# Patient Record
Sex: Female | Born: 1945 | Race: White | Hispanic: No | Smoking: Former smoker
Health system: Southern US, Community
[De-identification: ages and names within clinical notes are randomized; demographics above are authoritative.]

## PROBLEM LIST (undated history)

## (undated) DIAGNOSIS — J189 Pneumonia, unspecified organism: Secondary | ICD-10-CM

## (undated) DIAGNOSIS — I10 Essential (primary) hypertension: Principal | ICD-10-CM

## (undated) DIAGNOSIS — M1990 Unspecified osteoarthritis, unspecified site: Secondary | ICD-10-CM

## (undated) DIAGNOSIS — K219 Gastro-esophageal reflux disease without esophagitis: Secondary | ICD-10-CM

## (undated) DIAGNOSIS — R011 Cardiac murmur, unspecified: Secondary | ICD-10-CM

## (undated) DIAGNOSIS — Z8719 Personal history of other diseases of the digestive system: Secondary | ICD-10-CM

## (undated) DIAGNOSIS — E785 Hyperlipidemia, unspecified: Secondary | ICD-10-CM

## (undated) DIAGNOSIS — C801 Malignant (primary) neoplasm, unspecified: Secondary | ICD-10-CM

## (undated) HISTORY — PX: CATARACT EXTRACTION W/ INTRAOCULAR LENS IMPLANT: SHX1309

## (undated) HISTORY — PX: MINOR IRRIGATION AND DEBRIDEMENT OF WOUND: SHX6239

## (undated) HISTORY — PX: OTHER SURGICAL HISTORY: SHX169

## (undated) HISTORY — DX: Hyperlipidemia, unspecified: E78.5

## (undated) HISTORY — DX: Essential (primary) hypertension: I10

## (undated) HISTORY — PX: COLONOSCOPY: SHX174

## (undated) HISTORY — DX: Cardiac murmur, unspecified: R01.1

## (undated) HISTORY — PX: ESOPHAGOGASTRODUODENOSCOPY: SHX1529

## (undated) HISTORY — PX: TONSILLECTOMY: SUR1361

---

## 2001-06-16 ENCOUNTER — Other Ambulatory Visit: Admission: RE | Admit: 2001-06-16 | Discharge: 2001-06-16 | Payer: Self-pay | Admitting: Obstetrics and Gynecology

## 2001-10-01 ENCOUNTER — Encounter: Payer: Self-pay | Admitting: Obstetrics and Gynecology

## 2001-10-01 ENCOUNTER — Encounter: Admission: RE | Admit: 2001-10-01 | Discharge: 2001-10-01 | Payer: Self-pay | Admitting: Obstetrics and Gynecology

## 2002-12-09 ENCOUNTER — Encounter: Admission: RE | Admit: 2002-12-09 | Discharge: 2002-12-09 | Payer: Self-pay | Admitting: Obstetrics and Gynecology

## 2002-12-09 ENCOUNTER — Encounter: Payer: Self-pay | Admitting: Obstetrics and Gynecology

## 2003-02-02 ENCOUNTER — Other Ambulatory Visit: Admission: RE | Admit: 2003-02-02 | Discharge: 2003-02-02 | Payer: Self-pay | Admitting: Obstetrics and Gynecology

## 2003-04-21 ENCOUNTER — Encounter: Admission: RE | Admit: 2003-04-21 | Discharge: 2003-04-21 | Payer: Self-pay | Admitting: Obstetrics and Gynecology

## 2005-06-12 ENCOUNTER — Encounter: Admission: RE | Admit: 2005-06-12 | Discharge: 2005-06-12 | Payer: Self-pay | Admitting: Obstetrics and Gynecology

## 2005-07-03 ENCOUNTER — Encounter: Admission: RE | Admit: 2005-07-03 | Discharge: 2005-07-03 | Payer: Self-pay | Admitting: Obstetrics and Gynecology

## 2005-08-20 ENCOUNTER — Encounter: Admission: RE | Admit: 2005-08-20 | Discharge: 2005-08-20 | Payer: Self-pay | Admitting: Internal Medicine

## 2006-02-20 ENCOUNTER — Encounter: Admission: RE | Admit: 2006-02-20 | Discharge: 2006-02-20 | Payer: Self-pay | Admitting: Internal Medicine

## 2007-08-08 ENCOUNTER — Other Ambulatory Visit: Admission: RE | Admit: 2007-08-08 | Discharge: 2007-08-08 | Payer: Self-pay | Admitting: Obstetrics and Gynecology

## 2009-04-13 ENCOUNTER — Observation Stay (HOSPITAL_COMMUNITY): Admission: EM | Admit: 2009-04-13 | Discharge: 2009-04-14 | Payer: Self-pay | Admitting: Emergency Medicine

## 2009-04-20 ENCOUNTER — Encounter: Admission: RE | Admit: 2009-04-20 | Discharge: 2009-04-20 | Payer: Self-pay | Admitting: Neurosurgery

## 2009-05-05 ENCOUNTER — Encounter: Admission: RE | Admit: 2009-05-05 | Discharge: 2009-05-05 | Payer: Self-pay | Admitting: Neurosurgery

## 2010-06-11 ENCOUNTER — Encounter: Payer: Self-pay | Admitting: Internal Medicine

## 2010-06-11 ENCOUNTER — Encounter: Payer: Self-pay | Admitting: Obstetrics and Gynecology

## 2010-08-23 LAB — BASIC METABOLIC PANEL
BUN: 10 mg/dL (ref 6–23)
CO2: 28 mEq/L (ref 19–32)
Calcium: 9.5 mg/dL (ref 8.4–10.5)
Calcium: 9.5 mg/dL (ref 8.4–10.5)
Chloride: 106 mEq/L (ref 96–112)
Creatinine, Ser: 0.77 mg/dL (ref 0.4–1.2)
Creatinine, Ser: 0.81 mg/dL (ref 0.4–1.2)
GFR calc Af Amer: >60 mL/min (ref >60)
GFR calc non Af Amer: >60 mL/min (ref >60)
Glucose, Bld: 125 mg/dL — ABNORMAL HIGH (ref 70–99)
Sodium: 142 mEq/L (ref 135–145)

## 2010-08-23 LAB — CBC
Hemoglobin: 12.9 g/dL (ref 12.0–15.0)
MCHC: 33.7 g/dL (ref 30.0–36.0)
MCHC: 34.6 g/dL (ref 30.0–36.0)
MCV: 93.9 fL (ref 78.0–100.0)
RBC: 4.01 MIL/uL (ref 3.87–5.11)
RDW: 12.6 % (ref 11.5–15.5)
RDW: 12.7 % (ref 11.5–15.5)

## 2011-01-31 ENCOUNTER — Other Ambulatory Visit: Payer: Self-pay | Admitting: Obstetrics and Gynecology

## 2012-01-14 ENCOUNTER — Other Ambulatory Visit (HOSPITAL_COMMUNITY): Payer: Self-pay | Admitting: Cardiovascular Disease

## 2012-01-14 ENCOUNTER — Ambulatory Visit (HOSPITAL_COMMUNITY)
Admission: RE | Admit: 2012-01-14 | Discharge: 2012-01-14 | Disposition: A | Discharge status: Home / Self Care | Payer: BC Managed Care – PPO | Source: Ambulatory Visit | Attending: Cardiovascular Disease | Admitting: Cardiovascular Disease

## 2012-01-14 DIAGNOSIS — R059 Cough, unspecified: Secondary | ICD-10-CM | POA: Insufficient documentation

## 2012-01-14 DIAGNOSIS — R05 Cough: Secondary | ICD-10-CM

## 2012-09-08 ENCOUNTER — Ambulatory Visit (HOSPITAL_COMMUNITY)
Admission: RE | Admit: 2012-09-08 | Discharge: 2012-09-08 | Disposition: A | Discharge status: Home / Self Care | Payer: Medicare Other | Source: Ambulatory Visit | Attending: Internal Medicine | Admitting: Internal Medicine

## 2012-09-08 DIAGNOSIS — R011 Cardiac murmur, unspecified: Secondary | ICD-10-CM | POA: Insufficient documentation

## 2012-09-08 DIAGNOSIS — I10 Essential (primary) hypertension: Secondary | ICD-10-CM | POA: Insufficient documentation

## 2012-09-08 NOTE — Progress Notes (Signed)
*  PRELIMINARY RESULTS* Echocardiogram 2D Echocardiogram has been performed.  Amy Cannon, Amy Cannon 09/08/2012, 11:47 AM

## 2012-09-29 ENCOUNTER — Ambulatory Visit: Payer: PRIVATE HEALTH INSURANCE | Admitting: Family Medicine

## 2013-02-16 ENCOUNTER — Telehealth (HOSPITAL_COMMUNITY): Payer: Self-pay | Admitting: *Deleted

## 2013-02-16 ENCOUNTER — Other Ambulatory Visit: Payer: Self-pay | Admitting: Cardiovascular Disease

## 2013-02-16 LAB — COMPREHENSIVE METABOLIC PANEL
ALT: 17 U/L (ref 0–35)
Albumin: 4.1 g/dL (ref 3.5–5.2)
CO2: 31 mEq/L (ref 19–32)
Calcium: 10.4 mg/dL (ref 8.4–10.5)
Chloride: 104 mEq/L (ref 96–112)
Glucose, Bld: 93 mg/dL (ref 70–99)
Potassium: 4.6 mEq/L (ref 3.5–5.3)
Sodium: 140 mEq/L (ref 135–145)
Total Protein: 6.5 g/dL (ref 6.0–8.3)

## 2013-02-16 LAB — CBC WITH DIFFERENTIAL/PLATELET
Eosinophils Absolute: 0.1 10*3/uL (ref 0.0–0.7)
Hemoglobin: 14.3 g/dL (ref 12.0–15.0)
Lymphocytes Relative: 28 % (ref 12–46)
Lymphs Abs: 1.4 10*3/uL (ref 0.7–4.0)
MCH: 30.6 pg (ref 26.0–34.0)
MCV: 92.3 fL (ref 78.0–100.0)
Monocytes Relative: 8 % (ref 3–12)
Neutrophils Relative %: 62 % (ref 43–77)
Platelets: 292 10*3/uL (ref 150–400)
RBC: 4.67 MIL/uL (ref 3.87–5.11)
WBC: 5.1 10*3/uL (ref 4.0–10.5)

## 2013-02-16 LAB — LIPID PANEL
Cholesterol: 179 mg/dL (ref 0–200)
Triglycerides: 73 mg/dL (ref <150)
VLDL: 15 mg/dL (ref 0–40)

## 2013-02-17 ENCOUNTER — Encounter: Payer: Self-pay | Admitting: Cardiovascular Disease

## 2013-02-17 ENCOUNTER — Other Ambulatory Visit (HOSPITAL_COMMUNITY): Payer: Self-pay | Admitting: Cardiovascular Disease

## 2013-02-17 DIAGNOSIS — R079 Chest pain, unspecified: Secondary | ICD-10-CM

## 2013-02-24 ENCOUNTER — Encounter (HOSPITAL_COMMUNITY): Payer: PRIVATE HEALTH INSURANCE

## 2013-03-03 ENCOUNTER — Ambulatory Visit (HOSPITAL_COMMUNITY)
Admission: RE | Admit: 2013-03-03 | Discharge: 2013-03-03 | Disposition: A | Discharge status: Home / Self Care | Payer: Medicare Other | Source: Ambulatory Visit | Attending: Cardiovascular Disease | Admitting: Cardiovascular Disease

## 2013-03-03 ENCOUNTER — Other Ambulatory Visit: Payer: Self-pay | Admitting: *Deleted

## 2013-03-03 ENCOUNTER — Encounter: Payer: Self-pay | Admitting: *Deleted

## 2013-03-03 DIAGNOSIS — Z87891 Personal history of nicotine dependence: Secondary | ICD-10-CM | POA: Insufficient documentation

## 2013-03-03 DIAGNOSIS — I10 Essential (primary) hypertension: Secondary | ICD-10-CM | POA: Insufficient documentation

## 2013-03-03 DIAGNOSIS — R079 Chest pain, unspecified: Secondary | ICD-10-CM

## 2013-03-03 DIAGNOSIS — Z8249 Family history of ischemic heart disease and other diseases of the circulatory system: Secondary | ICD-10-CM | POA: Insufficient documentation

## 2013-03-03 DIAGNOSIS — R011 Cardiac murmur, unspecified: Secondary | ICD-10-CM | POA: Insufficient documentation

## 2013-03-03 MED ORDER — TECHNETIUM TC 99M SESTAMIBI GENERIC - CARDIOLITE
10.9000 | Freq: Once | INTRAVENOUS | Status: AC | PRN
  Administered 2013-03-03: 10.9 via INTRAVENOUS

## 2013-03-03 MED ORDER — TECHNETIUM TC 99M SESTAMIBI GENERIC - CARDIOLITE
31.0000 | Freq: Once | INTRAVENOUS | Status: AC | PRN
  Administered 2013-03-03: 31 via INTRAVENOUS

## 2013-03-03 NOTE — Procedures (Addendum)
[facility] [address] [place] [address] [phone]  Cardiology Nuclear Med Study  Amy Cannon is Cannon 67 y.o. female     MRN : [id number]     DOB: November 02, 1945  Procedure Date: 03/03/2013  Nuclear Med Background Indication for Stress Test:  Evaluation for Ischemia History:  Murmur Cardiac Risk Factors: Family History - CAD, History of Smoking, Hypertension and Lipids  Symptoms:  Pt denies being symptomatic at this time.   Nuclear Pre-Procedure Caffeine/Decaff Intake:  1:00am NPO After: 11 am   IV Site: R Hand  IV 0.9% NS with Angio Cath:  22g  Chest Size (in):  n/Cannon IV Started by: Emmit Pomfret, RN  Height: 5\' 7"  (1.702 m)  Cup Size: C  BMI:  Body mass index is 21.92 kg/(m^2). Weight:  140 lb (63.504 kg)   Tech Comments:  n/Cannon    Nuclear Med Study 1 or 2 day study: 1 day  Stress Test Type:  Stress  Order Authorizing Provider:  Susa Griffins, MD   Resting Radionuclide: Technetium 16m Sestamibi  Resting Radionuclide Dose: 10.9 mCi   Stress Radionuclide:  Technetium 66m Sestamibi  Stress Radionuclide Dose: 31.0 mCi           Stress Protocol Rest HR: 67 Stress HR: 146  Rest BP: 152/100 Stress BP: 207/100  Exercise Time (min): 11:30 METS: 13.4   Predicted Max HR: 155 bpm % Max HR: 94.19 bpm Rate Pressure Product: 21308  Dose of Adenosine (mg):  n/Cannon Dose of Lexiscan: n/Cannon mg  Dose of Atropine (mg): n/Cannon Dose of Dobutamine: n/Cannon mcg/kg/min (at max HR)  Stress Test Technologist: Esperanza Sheets, CCT Nuclear Technologist: Gonzella Lex, CNMT   Rest Procedure:  Myocardial perfusion imaging was performed at rest 45 minutes following the intravenous administration of Technetium 69m Sestamibi. Stress Procedure:  The patient performed treadmill exercise using Cannon Bruce  Protocol for 11zz;30 minutes. The patient stopped due to SOB and denied any chest pain.  There were no significant ST-T wave changes.  Technetium 20m  Sestamibi was injected at peak exercise and myocardial perfusion imaging was performed after Cannon brief delay.  Transient Ischemic Dilatation (Normal <1.22):  0.79 Lung/Heart Ratio (Normal <0.45):  0.32 QGS EDV:  70 ml QGS ESV:  21 ml LV Ejection Fraction: 70%  Signed by     Rest ECG: NSR - Normal EKG  Stress ECG: No significant ST segment change suggestive of ischemia; PVC during Stage 4 of exercise  QPS Raw Data Images:  Normal; no motion artifact; normal heart/lung ratio. Stress Images:  Normal homogeneous uptake in all areas of the myocardium. Rest Images:  Normal homogeneous uptake in all areas of the myocardium. Subtraction (SDS):  Normal  Impression Exercise Capacity:  Good exercise capacity. BP Response:  Hypertensive blood pressure response. Clinical Symptoms:  No significant symptoms noted. ECG Impression:  No significant ST segment change suggestive of ischemia. Comparison with Prior Nuclear Study: No images to compare  Overall Impression:  Normal stress nuclear study. Pt was hypertensive at rest and peak BP increased to 207/100.   LV Wall Motion:  NL LV Function, EF 70%; NL Wall Motion   Amy Bardon A, MD  03/03/2013 3:23 PM

## 2013-03-04 NOTE — Telephone Encounter (Signed)
Pt. Called and informed that her pharmacy service has been moved over to [facility].

## 2013-03-11 ENCOUNTER — Other Ambulatory Visit: Payer: Self-pay | Admitting: *Deleted

## 2013-03-11 MED ORDER — LOSARTAN POTASSIUM 100 MG PO TABS: 100.0000 mg | ORAL_TABLET | Freq: Two times a day (BID) | ORAL | Status: DC

## 2013-03-11 MED ORDER — PRAVASTATIN SODIUM 20 MG PO TABS: 20.0000 mg | ORAL_TABLET | Freq: Every day | ORAL | Status: DC

## 2013-03-11 NOTE — Telephone Encounter (Signed)
Rx was sent to pharmacy electronically. 

## 2013-09-03 ENCOUNTER — Other Ambulatory Visit (HOSPITAL_COMMUNITY): Payer: Self-pay | Admitting: Internal Medicine

## 2013-09-03 ENCOUNTER — Ambulatory Visit (HOSPITAL_COMMUNITY)
Admission: RE | Admit: 2013-09-03 | Discharge: 2013-09-03 | Disposition: A | Discharge status: Home / Self Care | Payer: Medicare Other | Source: Ambulatory Visit | Attending: Internal Medicine | Admitting: Internal Medicine

## 2013-09-03 DIAGNOSIS — S8990XA Unspecified injury of unspecified lower leg, initial encounter: Secondary | ICD-10-CM | POA: Insufficient documentation

## 2013-09-03 DIAGNOSIS — M25473 Effusion, unspecified ankle: Secondary | ICD-10-CM | POA: Insufficient documentation

## 2013-09-03 DIAGNOSIS — S99919A Unspecified injury of unspecified ankle, initial encounter: Principal | ICD-10-CM

## 2013-09-03 DIAGNOSIS — M25571 Pain in right ankle and joints of right foot: Secondary | ICD-10-CM

## 2013-09-03 DIAGNOSIS — S99929A Unspecified injury of unspecified foot, initial encounter: Principal | ICD-10-CM

## 2013-09-03 DIAGNOSIS — M25476 Effusion, unspecified foot: Secondary | ICD-10-CM | POA: Insufficient documentation

## 2013-09-03 DIAGNOSIS — M25579 Pain in unspecified ankle and joints of unspecified foot: Secondary | ICD-10-CM | POA: Insufficient documentation

## 2013-09-14 ENCOUNTER — Ambulatory Visit (HOSPITAL_COMMUNITY)
Admission: RE | Admit: 2013-09-14 | Discharge: 2013-09-14 | Disposition: A | Discharge status: Home / Self Care | Payer: Medicare Other | Source: Ambulatory Visit | Attending: Internal Medicine | Admitting: Internal Medicine

## 2013-09-14 ENCOUNTER — Other Ambulatory Visit (HOSPITAL_COMMUNITY): Payer: Self-pay | Admitting: Internal Medicine

## 2013-09-14 DIAGNOSIS — R059 Cough, unspecified: Secondary | ICD-10-CM | POA: Insufficient documentation

## 2013-09-14 DIAGNOSIS — R05 Cough: Secondary | ICD-10-CM | POA: Insufficient documentation

## 2013-09-14 DIAGNOSIS — I509 Heart failure, unspecified: Secondary | ICD-10-CM | POA: Insufficient documentation

## 2013-09-14 DIAGNOSIS — J9819 Other pulmonary collapse: Secondary | ICD-10-CM | POA: Insufficient documentation

## 2014-04-26 ENCOUNTER — Telehealth: Payer: Self-pay | Admitting: Cardiovascular Disease

## 2014-04-27 NOTE — Telephone Encounter (Signed)
Closed encounter °

## 2014-05-03 ENCOUNTER — Other Ambulatory Visit: Payer: Self-pay | Admitting: *Deleted

## 2014-05-03 ENCOUNTER — Encounter: Payer: Self-pay | Admitting: Cardiovascular Disease

## 2014-05-03 ENCOUNTER — Ambulatory Visit (INDEPENDENT_AMBULATORY_CARE_PROVIDER_SITE_OTHER): Payer: Medicare Other | Admitting: Cardiovascular Disease

## 2014-05-03 VITALS — BP 130/94 | HR 62 | Ht 66.0 in | Wt 139.5 lb

## 2014-05-03 DIAGNOSIS — I10 Essential (primary) hypertension: Secondary | ICD-10-CM | POA: Insufficient documentation

## 2014-05-03 HISTORY — DX: Essential (primary) hypertension: I10

## 2014-05-03 MED ORDER — IRBESARTAN-HYDROCHLOROTHIAZIDE 300-12.5 MG PO TABS: 1.0000 | ORAL_TABLET | Freq: Every day | ORAL | Status: DC

## 2014-05-03 NOTE — Patient Instructions (Signed)
STOP Losartan.  START Irbesartan HCTZ 300/12.5mg  daily.  Dr. Sallyanne Kuster recommends that you schedule a follow-up appointment in: one year.

## 2014-05-03 NOTE — Progress Notes (Signed)
Patient ID: Amy Cannon, female   DOB: [birth year], 68 y.o.   MRN: [id number]     Reason for office visit Hypertension  Mrs. Rheaume is a long-term patient and friend of Dr. Terance Ice. She is here to establish follow-up since he has now retired. 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  She has a history of familial hypertension that had been well controlled in the past but recently she has been waking up every day with systolic blood pressure around 409 diastolic blood pressure 95. She is very active and walks long distances [place] every day. When she rechecks her blood pressure in the afternoon her blood pressures typically around 138/85 mmHg. The blood pressure is still elevated despite the fact that she is taking losartan 100 mg twice a day. Dr. Rollene Fare had recommended amlodipine 5 mg daily but she was unable to tolerate this. She developed dizziness while taking it. This occurred even though her blood pressure remained firmly in the normal range (130s/80s) and symptoms resolved within a couple of days of stopping amlodipine.  She has had some emotional problems.  3 weeks ago, her partner of 3 years passed away following cardiac complications of radiation and chemotherapy for esophageal cancer.  When I rechecked her blood pressure in the clinic it was even higher at 170/92 mmHg, but we had just finished discussing her recent loss.  Her echocardiogram in September 2014 show mild left ventricular hypertrophy (wall thickness around 12 mm) but was otherwise completely normal, including normal parameters of diastolic function. She has a history of a normal nuclear stress test in 2009. Her electrocardiogram today is completely normal.  No Known Allergies  Current Outpatient Prescriptions  Medication Sig Dispense Refill  . aspirin 81 MG tablet Take 81 mg by mouth.    . B Complex Vitamins (VITAMIN-B COMPLEX PO) Take by mouth daily.    . Calcium Carbonate-Vit D-Min (CALCIUM 1200 PO)  Take by mouth daily.    . Coenzyme Q10 (CO Q 10 PO) Take by mouth daily.    Marland Kitchen glucosamine-chondroitin 500-400 MG tablet Take 1 tablet by mouth daily.    . Grape Seed Extract 100 MG CAPS Take 100 mg by mouth daily.    . Magnesium 400 MG CAPS Take 400 mg by mouth 2 (two) times daily.    . Melatonin 3 MG CAPS Take 3 mg by mouth at bedtime.    . Omega-3 Fatty Acids (OMEGA-3 FISH OIL PO) Take by mouth daily.    . pantoprazole (PROTONIX) 40 MG tablet Take 40 mg by mouth daily.   12  . raloxifene (EVISTA) 60 MG tablet Take 60 mg by mouth daily.    Marland Kitchen VITAMIN A PO Take by mouth.    . irbesartan-hydrochlorothiazide (AVALIDE) 300-12.5 MG per tablet Take 1 tablet by mouth daily. 30 tablet 11   No current facility-administered medications for this visit.    Past Medical History  Diagnosis Date  . Murmur, cardiac     2d-echo on 09-08-2012 EF  55-60% normal study, mild LVH, no significant valve disease  . HTN (hypertension)     stress test 08-04-2007 EF 70%; low risk scan , no significant ischemia  . Hyperlipemia     No past surgical history on file.  Family History  Problem Relation Age of Onset  . CAD      History   Social History  . Marital Status: Widowed    Spouse Name: N/A    Number of  Children: N/A  . Years of Education: N/A   Occupational History  . Not on file.   Social History Main Topics  . Smoking status: Never Smoker   . Smokeless tobacco: Not on file  . Alcohol Use: 3.0 oz/week    5 Not specified per week  . Drug Use: No  . Sexual Activity: Not on file   Other Topics Concern  . Not on file   Social History Narrative    Review of systems: The patient specifically denies any chest pain at rest or with exertion, dyspnea at rest or with exertion, orthopnea, paroxysmal nocturnal dyspnea, syncope, palpitations, focal neurological deficits, intermittent claudication, lower extremity edema, unexplained weight gain, cough, hemoptysis or wheezing.  The patient also  denies abdominal pain, nausea, vomiting, dysphagia, diarrhea, constipation, polyuria, polydipsia, dysuria, hematuria, frequency, urgency, abnormal bleeding or bruising, fever, chills, unexpected weight changes, mood swings, change in skin or hair texture, change in voice quality, auditory or visual problems, allergic reactions or rashes, new musculoskeletal complaints other than usual "aches and pains".   PHYSICAL EXAM BP 130/94 mmHg  Pulse 62  Ht 5' 6"  (1.676 m)  Wt 139 lb 8 oz (63.277 kg)  BMI 22.53 kg/m2  General: Alert, oriented x3, no distress Head: no evidence of trauma, PERRL, EOMI, no exophtalmos or lid lag, no myxedema, no xanthelasma; normal ears, nose and oropharynx Neck: normal jugular venous pulsations and no hepatojugular reflux; brisk carotid pulses without delay and no carotid bruits Chest: clear to auscultation, no signs of consolidation by percussion or palpation, normal fremitus, symmetrical and full respiratory excursions Cardiovascular: normal position and quality of the apical impulse, regular rhythm, normal first and second heart sounds, no murmurs, rubs or gallops Abdomen: no tenderness or distention, no masses by palpation, no abnormal pulsatility or arterial bruits, normal bowel sounds, no hepatosplenomegaly Extremities: no clubbing, cyanosis or edema; 2+ radial, ulnar and brachial pulses bilaterally; 2+ right femoral, posterior tibial and dorsalis pedis pulses; 2+ left femoral, posterior tibial and dorsalis pedis pulses; no subclavian or femoral bruits Neurological: grossly nonfocal   Lipid Panel     Component Value Date/Time   CHOL 179 02/16/2013 1014   TRIG 73 02/16/2013 1014   HDL 83 02/16/2013 1014   CHOLHDL 2.2 02/16/2013 1014   VLDL 15 02/16/2013 1014   LDLCALC 81 02/16/2013 1014    BMET    Component Value Date/Time   NA 140 02/16/2013 1014   K 4.6 02/16/2013 1014   CL 104 02/16/2013 1014   CO2 31 02/16/2013 1014   GLUCOSE 93 02/16/2013 1014    BUN 11 02/16/2013 1014   CREATININE 0.72 02/16/2013 1014   CREATININE 0.77 04/14/2009 0325   CALCIUM 10.4 02/16/2013 1014   GFRNONAA >60 04/14/2009 0325   GFRAA  04/14/2009 0325    >60        The eGFR has been calculated using the MDRD equation. This calculation has not been validated in all clinical situations. eGFR's persistently <60 mL/min signify possible Chronic Kidney Disease.     ASSESSMENT AND PLAN  Mrs. Moshier clearly has systemic hypertension and needs improved control. She will not be able to obtain the losartan 100 mg twice a day anymore since her insurance company will not cover that strength. We'll switch to irbesartan/hydrochlorothiazide 300/12.5 mg once daily and reevaluate labs and clinical response in a few weeks. She is familiar with the my chart application and will send me her blood pressure recordings through it.  Orders Placed This Encounter  Procedures  . EKG 12-Lead   Meds ordered this encounter  Medications  . pantoprazole (PROTONIX) 40 MG tablet    Sig: Take 40 mg by mouth daily.     Refill:  12  . Melatonin 3 MG CAPS    Sig: Take 3 mg by mouth at bedtime.  . Coenzyme Q10 (CO Q 10 PO)    Sig: Take by mouth daily.  . Magnesium 400 MG CAPS    Sig: Take 400 mg by mouth 2 (two) times daily.  . B Complex Vitamins (VITAMIN-B COMPLEX PO)    Sig: Take by mouth daily.  . Omega-3 Fatty Acids (OMEGA-3 FISH OIL PO)    Sig: Take by mouth daily.  Marland Kitchen glucosamine-chondroitin 500-400 MG tablet    Sig: Take 1 tablet by mouth daily.  . Grape Seed Extract 100 MG CAPS    Sig: Take 100 mg by mouth daily.  . Calcium Carbonate-Vit D-Min (CALCIUM 1200 PO)    Sig: Take by mouth daily.  . irbesartan-hydrochlorothiazide (AVALIDE) 300-12.5 MG per tablet    Sig: Take 1 tablet by mouth daily.    Dispense:  30 tablet    Refill:  [address], [place], [facility] [phone] office [phone] pager

## 2015-02-07 ENCOUNTER — Other Ambulatory Visit: Payer: Self-pay | Admitting: Cardiovascular Disease

## 2015-02-08 NOTE — Telephone Encounter (Signed)
Rx request sent to pharmacy.  

## 2015-06-14 ENCOUNTER — Other Ambulatory Visit: Payer: Self-pay | Admitting: Cardiovascular Disease

## 2015-06-14 NOTE — Telephone Encounter (Signed)
Rx(s) sent to pharmacy electronically.  

## 2015-07-07 ENCOUNTER — Other Ambulatory Visit: Payer: Self-pay | Admitting: Cardiovascular Disease

## 2015-07-08 NOTE — Telephone Encounter (Signed)
Rx refill sent to pharmacy. 

## 2015-07-12 ENCOUNTER — Other Ambulatory Visit: Payer: Self-pay | Admitting: *Deleted

## 2015-07-12 MED ORDER — IRBESARTAN-HYDROCHLOROTHIAZIDE 300-12.5 MG PO TABS: 1.0000 | ORAL_TABLET | Freq: Every day | ORAL | Status: DC

## 2015-08-02 ENCOUNTER — Other Ambulatory Visit: Payer: Self-pay | Admitting: Cardiovascular Disease

## 2015-10-28 ENCOUNTER — Other Ambulatory Visit: Payer: Self-pay | Admitting: Cardiovascular Disease

## 2015-10-28 NOTE — Telephone Encounter (Signed)
Rx request sent to pharmacy.  

## 2015-11-04 ENCOUNTER — Other Ambulatory Visit: Payer: Self-pay | Admitting: Cardiovascular Disease

## 2016-04-23 ENCOUNTER — Other Ambulatory Visit: Payer: Self-pay | Admitting: *Deleted

## 2016-04-23 DIAGNOSIS — R5381 Other malaise: Secondary | ICD-10-CM

## 2016-04-24 ENCOUNTER — Other Ambulatory Visit: Payer: Self-pay | Admitting: *Deleted

## 2016-04-24 DIAGNOSIS — R5381 Other malaise: Secondary | ICD-10-CM

## 2016-04-24 DIAGNOSIS — M859 Disorder of bone density and structure, unspecified: Secondary | ICD-10-CM

## 2016-04-25 ENCOUNTER — Other Ambulatory Visit: Payer: Self-pay | Admitting: *Deleted

## 2016-04-26 ENCOUNTER — Other Ambulatory Visit: Payer: Self-pay | Admitting: *Deleted

## 2016-04-26 DIAGNOSIS — E2839 Other primary ovarian failure: Secondary | ICD-10-CM

## 2016-04-26 DIAGNOSIS — M8580 Other specified disorders of bone density and structure, unspecified site: Secondary | ICD-10-CM

## 2016-05-04 ENCOUNTER — Ambulatory Visit
Admission: RE | Admit: 2016-05-04 | Discharge: 2016-05-04 | Disposition: A | Discharge status: Home / Self Care | Payer: Medicare Other | Source: Ambulatory Visit | Attending: *Deleted | Admitting: *Deleted

## 2016-05-04 DIAGNOSIS — M8580 Other specified disorders of bone density and structure, unspecified site: Secondary | ICD-10-CM

## 2016-05-04 DIAGNOSIS — E2839 Other primary ovarian failure: Secondary | ICD-10-CM

## 2016-06-26 ENCOUNTER — Other Ambulatory Visit (HOSPITAL_COMMUNITY): Payer: Self-pay | Admitting: Internal Medicine

## 2016-06-26 ENCOUNTER — Ambulatory Visit (HOSPITAL_COMMUNITY)
Admission: RE | Admit: 2016-06-26 | Discharge: 2016-06-26 | Disposition: A | Discharge status: Home / Self Care | Payer: Medicare Other | Source: Ambulatory Visit | Attending: Internal Medicine | Admitting: Internal Medicine

## 2016-06-26 DIAGNOSIS — R05 Cough: Secondary | ICD-10-CM | POA: Diagnosis present

## 2016-06-26 DIAGNOSIS — R918 Other nonspecific abnormal finding of lung field: Secondary | ICD-10-CM | POA: Diagnosis not present

## 2016-06-26 DIAGNOSIS — R509 Fever, unspecified: Secondary | ICD-10-CM | POA: Insufficient documentation

## 2016-06-26 DIAGNOSIS — B59 Pneumocystosis: Secondary | ICD-10-CM

## 2016-06-26 DIAGNOSIS — R5081 Fever presenting with conditions classified elsewhere: Secondary | ICD-10-CM

## 2016-06-26 DIAGNOSIS — R059 Cough, unspecified: Secondary | ICD-10-CM

## 2017-04-22 ENCOUNTER — Encounter: Payer: Self-pay | Admitting: Cardiovascular Disease

## 2017-04-22 ENCOUNTER — Ambulatory Visit (INDEPENDENT_AMBULATORY_CARE_PROVIDER_SITE_OTHER): Payer: Medicare Other | Admitting: Cardiovascular Disease

## 2017-04-22 VITALS — BP 112/84 | HR 69 | Ht 66.0 in | Wt 139.6 lb

## 2017-04-22 DIAGNOSIS — I10 Essential (primary) hypertension: Secondary | ICD-10-CM | POA: Diagnosis not present

## 2017-04-22 DIAGNOSIS — M810 Age-related osteoporosis without current pathological fracture: Secondary | ICD-10-CM

## 2017-04-22 DIAGNOSIS — Z79899 Other long term (current) drug therapy: Secondary | ICD-10-CM | POA: Diagnosis not present

## 2017-04-22 DIAGNOSIS — R5383 Other fatigue: Secondary | ICD-10-CM | POA: Diagnosis not present

## 2017-04-22 DIAGNOSIS — E785 Hyperlipidemia, unspecified: Secondary | ICD-10-CM | POA: Diagnosis not present

## 2017-04-22 NOTE — Progress Notes (Signed)
Cardiology Office Note:    Date:  04/24/2017   ID:  Amy Cannon, DOB 01-Nov-1945, MRN [id number]  PCP:  Asencion Noble, MD  Cardiologist:  Sanda Klein, MD    Referring MD: Asencion Noble, MD   Chief Complaint  Patient presents with  . Chest Pain    NO COMPLAINTS OF CHEST PAINS   . Shortness of Breath    NO DIZZINESS   Follow-up hypertension  History of Present Illness:    Amy Cannon is a 71 y.o. female with a hx of systemic hypertension and mild hyperlipidemia with an innocent murmur on physical exam. This is her first visit since 2015.  She has no cardiovascular complaints.The patient specifically denies any chest pain at rest exertion, dyspnea at rest or with exertion, orthopnea, paroxysmal nocturnal dyspnea, syncope, palpitations, focal neurological deficits, intermittent claudication, lower extremity edema, unexplained weight gain, cough, hemoptysis or wheezing. She complains of some fatigue, but is much more active than the average person her age.  She has discovered that she has osteoporosis and thinks this may be related to long-standing treatment with proton pump inhibitors, which may be true. She continues to ride horses regularly and is generally very active.  Past Medical History:  Diagnosis Date  . Essential hypertension 05/03/2014  . HTN (hypertension)    stress test 08-04-2007 EF 70%; low risk scan , no significant ischemia  . Hyperlipemia   . Murmur, cardiac    2d-echo on 09-08-2012 EF  55-60% normal study, mild LVH, no significant valve disease    History reviewed. No pertinent surgical history.  Current Medications: Current Meds  Medication Sig  . aspirin 81 MG tablet Take 81 mg by mouth.  . B Complex Vitamins (VITAMIN-B COMPLEX PO) Take by mouth daily.  . Calcium Carbonate-Vit D-Min (CALCIUM 1200 PO) Take by mouth daily.  . Coenzyme Q10 (CO Q 10 PO) Take by mouth daily.  Marland Kitchen glucosamine-chondroitin 500-400 MG tablet Take 1 tablet by mouth daily.  . Grape Seed  Extract 100 MG CAPS Take 100 mg by mouth daily.  . irbesartan-hydrochlorothiazide (AVALIDE) 300-12.5 MG tablet Take 1 tablet by mouth daily. Please schedule appointment for refills.  . Magnesium 400 MG CAPS Take 400 mg by mouth 2 (two) times daily.  . Omega-3 Fatty Acids (OMEGA-3 FISH OIL PO) Take by mouth daily.  . raloxifene (EVISTA) 60 MG tablet Take 60 mg by mouth daily.  Marland Kitchen VITAMIN A PO Take by mouth.     Allergies:   Lisinopril   Social History   Socioeconomic History  . Marital status: Widowed    Spouse name: None  . Number of children: None  . Years of education: None  . Highest education level: None  Social Needs  . Financial resource strain: None  . Food insecurity - worry: None  . Food insecurity - inability: None  . Transportation needs - medical: None  . Transportation needs - non-medical: None  Occupational History  . None  Tobacco Use  . Smoking status: Never Smoker  . Smokeless tobacco: Never Used  Substance and Sexual Activity  . Alcohol use: Yes    Alcohol/week: 3.0 oz    Types: 5 Standard drinks or equivalent per week  . Drug use: No  . Sexual activity: None  Other Topics Concern  . None  Social History Narrative  . None     Family History: The patient's family history includes CAD in her unknown relative. ROS:   Please see the history of present illness.  All other systems reviewed and are negative.  EKGs/Labs/Other Studies Reviewed:    EKG:  EKG is ordered today.  The ekg ordered today demonstrates normal sinus rhythm, normal QTC 420 ms  Recent Labs: No results found for requested labs within last 8760 hours.  Recent Lipid Panel    Component Value Date/Time   CHOL 179 02/16/2013 1014   TRIG 73 02/16/2013 1014   HDL 83 02/16/2013 1014   CHOLHDL 2.2 02/16/2013 1014   VLDL 15 02/16/2013 1014   LDLCALC 81 02/16/2013 1014    Physical Exam:    VS:  BP 112/84   Pulse 69   Ht 5\' 6"  (1.676 m)   Wt 139 lb 9.6 oz (63.3 kg)   BMI 22.53  kg/m     Wt Readings from Last 3 Encounters:  04/22/17 139 lb 9.6 oz (63.3 kg)  05/03/14 139 lb 8 oz (63.3 kg)  03/03/13 140 lb (63.5 kg)     GEN:  Well nourished, well developed in no acute distress HEENT: Normal NECK: No JVD; No carotid bruits LYMPHATICS: No lymphadenopathy CARDIAC: RRR, faint early peaking systolic ejection murmur in the aortic focus, no diastolic murmurs, rubs, gallops RESPIRATORY:  Clear to auscultation without rales, wheezing or rhonchi  ABDOMEN: Soft, non-tender, non-distended MUSCULOSKELETAL:  No edema; No deformity  SKIN: Warm and dry NEUROLOGIC:  Alert and oriented x 3 PSYCHIATRIC:  Normal affect   ASSESSMENT:    1. Essential hypertension   2. Age-related osteoporosis without current pathological fracture   3. Dyslipidemia   4. Other fatigue   5. Medication management    PLAN:    In order of problems listed above:  1. HTN: Excellent control on current medical regimen. No recommendation to change this. In September her ECG showed borderline left ventricular hypertrophy with wall thickness of around 12 mm. 2. Osteoporosis: She needs to have potassium and renal function parameters checked because of the blood pressure medications that she takes in her complaints of fatigue, will also check magnesium since she has been on proton pump inhibitors for a long time (stopped now) 3. HLP: This has been managed conservatively with diet and exercise. She is active and very lean and fit. Her diet sounds appropriate. Recheck a lipid profile.   Medication Adjustments/Labs and Tests Ordered: Current medicines are reviewed at length with the patient today.  Concerns regarding medicines are outlined above.  Orders Placed This Encounter  Procedures  . CBC  . Comprehensive metabolic panel  . Lipid panel  . Magnesium   No orders of the defined types were placed in this encounter.   Signed, Sanda Klein, MD  04/24/2017 6:26 PM    [place]

## 2017-04-22 NOTE — Patient Instructions (Signed)
Dr Sallyanne Kuster recommends that you continue on your current medications as directed. Please refer to the Current Medication list given to you today.  Your physician recommends that you return for lab work at your earliest [place].  Dr Sallyanne Kuster recommends that you follow-up with her as needed.

## 2017-04-24 ENCOUNTER — Encounter: Payer: Self-pay | Admitting: Cardiovascular Disease

## 2017-05-01 ENCOUNTER — Other Ambulatory Visit: Payer: Self-pay | Admitting: Cardiovascular Disease

## 2017-05-02 LAB — CBC WITH DIFFERENTIAL/PLATELET
BASOS ABS: 0 10*3/uL (ref 0.0–0.2)
Basos: 1 %
EOS (ABSOLUTE): 0.1 10*3/uL (ref 0.0–0.4)
Eos: 2 %
Hematocrit: 43 % (ref 34.0–46.6)
Hemoglobin: 14.1 g/dL (ref 11.1–15.9)
Immature Grans (Abs): 0 10*3/uL (ref 0.0–0.1)
Immature Granulocytes: 0 %
LYMPHS ABS: 1.9 10*3/uL (ref 0.7–3.1)
Lymphs: 33 %
MCH: 30.5 pg (ref 26.6–33.0)
MCHC: 32.8 g/dL (ref 31.5–35.7)
MCV: 93 fL (ref 79–97)
MONOS ABS: 0.4 10*3/uL (ref 0.1–0.9)
Monocytes: 7 %
NEUTROS ABS: 3.3 10*3/uL (ref 1.4–7.0)
Neutrophils: 57 %
PLATELETS: 277 10*3/uL (ref 150–379)
RBC: 4.63 x10E6/uL (ref 3.77–5.28)
RDW: 13.1 % (ref 12.3–15.4)
WBC: 5.8 10*3/uL (ref 3.4–10.8)

## 2017-05-02 LAB — COMPREHENSIVE METABOLIC PANEL
ALBUMIN: 4 g/dL (ref 3.6–4.8)
ALK PHOS: 71 IU/L (ref 39–117)
ALT: 18 IU/L (ref 0–32)
AST: 19 IU/L (ref 0–40)
Albumin/Globulin Ratio: 1.8 (ref 1.2–2.2)
BILIRUBIN TOTAL: 0.5 mg/dL (ref 0.0–1.2)
BUN / CREAT RATIO: 16 (ref 12–28)
BUN: 15 mg/dL (ref 8–27)
CHLORIDE: 103 mmol/L (ref 96–106)
CO2: 27 mmol/L (ref 20–29)
Calcium: 10.3 mg/dL (ref 8.7–10.3)
Creatinine, Ser: 0.95 mg/dL (ref 0.57–1.00)
GFR calc Af Amer: 71 mL/min/{1.73_m2} (ref >59)
GFR calc non Af Amer: 61 mL/min/{1.73_m2} (ref >59)
GLUCOSE: 93 mg/dL (ref 65–99)
Globulin, Total: 2.2 g/dL (ref 1.5–4.5)
Potassium: 5.1 mmol/L (ref 3.5–5.2)
Sodium: 143 mmol/L (ref 134–144)
Total Protein: 6.2 g/dL (ref 6.0–8.5)

## 2017-05-02 LAB — LIPID PANEL W/O CHOL/HDL RATIO
CHOLESTEROL TOTAL: 217 mg/dL — AB (ref 100–199)
HDL: 70 mg/dL (ref >39)
LDL Calculated: 125 mg/dL — ABNORMAL HIGH (ref 0–99)
TRIGLYCERIDES: 111 mg/dL (ref 0–149)
VLDL CHOLESTEROL CAL: 22 mg/dL (ref 5–40)

## 2017-05-02 LAB — MAGNESIUM: Magnesium: 2.2 mg/dL (ref 1.6–2.3)

## 2017-05-03 ENCOUNTER — Encounter: Payer: Self-pay | Admitting: Cardiovascular Disease

## 2017-07-08 ENCOUNTER — Telehealth: Payer: Self-pay | Admitting: Cardiovascular Disease

## 2017-07-08 NOTE — Telephone Encounter (Signed)
New message    /Pt c/o medication issue:  1. Name of Medication:irbesartan-hydrochlorothiazide (AVALIDE) 300-12.5 MG tablet  2. How are you currently taking this medication (dosage and times per day)? As prescribed  3. Are you having a reaction (difficulty breathing--STAT)? No  4. What is your medication issue? Patient received letter from [facility] regarding recall

## 2017-07-08 NOTE — Telephone Encounter (Signed)
Left message for pt to call.

## 2017-07-08 NOTE — Telephone Encounter (Signed)
Follow up    Patient returning call from [facility] about medication

## 2017-07-08 NOTE — Telephone Encounter (Signed)
Phone rings busy

## 2017-07-10 NOTE — Telephone Encounter (Signed)
It appears to be mainly one lab in [place] that is responsible for all the recalled products.  As far as we know, nobody else tried that same manufacturing process that led to the problem.  Irbesartan is a very good medication for lowering cholesterol, so we would encourage patients to stay on it as long as it is still available.

## 2017-07-10 NOTE — Telephone Encounter (Signed)
Addendum to previous note: irbesartan good medication for lowering blood pressure not cholesterol.

## 2017-07-10 NOTE — Telephone Encounter (Signed)
Left message to call back  

## 2017-07-10 NOTE — Telephone Encounter (Signed)
Received call from patient.She stated she got a call today from [place] saying her Manufacturing engineer is from [place] and has not been recalled.Stated she was told only irbesartan from [place] has been recalled.Stated she wanted to know what our pharmacy advised.Message sent to pur pharmacist.

## 2017-07-10 NOTE — Telephone Encounter (Signed)
Returned call to patient no answer.LMTC. 

## 2017-07-11 NOTE — Telephone Encounter (Signed)
Returned call to patient no answer.LMTC. 

## 2017-07-11 NOTE — Telephone Encounter (Signed)
Attempt to call back, line is busy

## 2017-07-11 NOTE — Telephone Encounter (Signed)
Follow up  ° ° °Patient is returning call.  °

## 2017-07-15 NOTE — Telephone Encounter (Signed)
Spoke with pt, aware of pharm md recommendations. 

## 2019-06-01 ENCOUNTER — Ambulatory Visit: Payer: Medicare Other | Attending: Internal Medicine

## 2019-06-01 ENCOUNTER — Other Ambulatory Visit: Payer: Self-pay

## 2019-06-01 DIAGNOSIS — Z20822 Contact with and (suspected) exposure to covid-19: Secondary | ICD-10-CM

## 2019-06-02 ENCOUNTER — Other Ambulatory Visit: Payer: Medicare Other

## 2019-06-02 LAB — NOVEL CORONAVIRUS, NAA: SARS-CoV-2, NAA: NOT DETECTED

## 2020-01-11 ENCOUNTER — Other Ambulatory Visit
Admission: RE | Admit: 2020-01-11 | Discharge: 2020-01-11 | Disposition: A | Discharge status: Home / Self Care | Payer: Medicare Other | Source: Ambulatory Visit | Attending: General Surgery | Admitting: General Surgery

## 2020-01-11 ENCOUNTER — Other Ambulatory Visit: Payer: Self-pay

## 2020-01-11 DIAGNOSIS — Z01812 Encounter for preprocedural laboratory examination: Secondary | ICD-10-CM | POA: Insufficient documentation

## 2020-01-11 DIAGNOSIS — Z20822 Contact with and (suspected) exposure to covid-19: Secondary | ICD-10-CM | POA: Diagnosis not present

## 2020-01-12 ENCOUNTER — Encounter: Payer: Self-pay | Admitting: General Surgery

## 2020-01-12 LAB — SARS CORONAVIRUS 2 (TAT 6-24 HRS): SARS Coronavirus 2: NEGATIVE

## 2020-01-13 ENCOUNTER — Ambulatory Visit: Payer: Medicare Other | Admitting: Registered Nurse

## 2020-01-13 ENCOUNTER — Encounter
Admission: RE | Disposition: A | Discharge status: Home / Self Care | Payer: Self-pay | Source: Home / Self Care | Attending: General Surgery

## 2020-01-13 ENCOUNTER — Other Ambulatory Visit: Payer: Self-pay

## 2020-01-13 ENCOUNTER — Ambulatory Visit
Admission: RE | Admit: 2020-01-13 | Discharge: 2020-01-13 | Disposition: A | Discharge status: Home / Self Care | Payer: Medicare Other | Attending: General Surgery | Admitting: General Surgery

## 2020-01-13 ENCOUNTER — Encounter: Payer: Self-pay | Admitting: General Surgery

## 2020-01-13 DIAGNOSIS — Z7982 Long term (current) use of aspirin: Secondary | ICD-10-CM | POA: Insufficient documentation

## 2020-01-13 DIAGNOSIS — K449 Diaphragmatic hernia without obstruction or gangrene: Secondary | ICD-10-CM | POA: Insufficient documentation

## 2020-01-13 DIAGNOSIS — M1990 Unspecified osteoarthritis, unspecified site: Secondary | ICD-10-CM | POA: Insufficient documentation

## 2020-01-13 DIAGNOSIS — Z79899 Other long term (current) drug therapy: Secondary | ICD-10-CM | POA: Diagnosis not present

## 2020-01-13 DIAGNOSIS — Z1211 Encounter for screening for malignant neoplasm of colon: Secondary | ICD-10-CM | POA: Insufficient documentation

## 2020-01-13 DIAGNOSIS — Z8042 Family history of malignant neoplasm of prostate: Secondary | ICD-10-CM | POA: Diagnosis not present

## 2020-01-13 DIAGNOSIS — D120 Benign neoplasm of cecum: Secondary | ICD-10-CM | POA: Diagnosis not present

## 2020-01-13 DIAGNOSIS — K2100 Gastro-esophageal reflux disease with esophagitis, without bleeding: Secondary | ICD-10-CM | POA: Diagnosis not present

## 2020-01-13 DIAGNOSIS — Z800 Family history of malignant neoplasm of digestive organs: Secondary | ICD-10-CM | POA: Insufficient documentation

## 2020-01-13 DIAGNOSIS — Z8249 Family history of ischemic heart disease and other diseases of the circulatory system: Secondary | ICD-10-CM | POA: Diagnosis not present

## 2020-01-13 DIAGNOSIS — I10 Essential (primary) hypertension: Secondary | ICD-10-CM | POA: Diagnosis not present

## 2020-01-13 DIAGNOSIS — Z86010 Personal history of colonic polyps: Secondary | ICD-10-CM | POA: Diagnosis not present

## 2020-01-13 HISTORY — DX: Unspecified osteoarthritis, unspecified site: M19.90

## 2020-01-13 HISTORY — PX: ESOPHAGOGASTRODUODENOSCOPY (EGD) WITH PROPOFOL: SHX5813

## 2020-01-13 HISTORY — DX: Gastro-esophageal reflux disease without esophagitis: K21.9

## 2020-01-13 HISTORY — PX: COLONOSCOPY WITH PROPOFOL: SHX5780

## 2020-01-13 SURGERY — ESOPHAGOGASTRODUODENOSCOPY (EGD) WITH PROPOFOL
Anesthesia: General

## 2020-01-13 MED ORDER — PROPOFOL 500 MG/50ML IV EMUL
INTRAVENOUS | Status: DC | PRN
  Administered 2020-01-13: 150 ug/kg/min via INTRAVENOUS

## 2020-01-13 MED ORDER — GLYCOPYRROLATE 0.2 MG/ML IJ SOLN
INTRAMUSCULAR | Status: DC | PRN
  Administered 2020-01-13: .2 mg via INTRAVENOUS

## 2020-01-13 MED ORDER — GLYCOPYRROLATE 0.2 MG/ML IJ SOLN
INTRAMUSCULAR | Status: AC
  Filled 2020-01-13: qty 1

## 2020-01-13 MED ORDER — SODIUM CHLORIDE 0.9 % IV SOLN: INTRAVENOUS | Status: DC

## 2020-01-13 MED ORDER — PROPOFOL 10 MG/ML IV BOLUS
INTRAVENOUS | Status: DC | PRN
  Administered 2020-01-13: 70 mg via INTRAVENOUS

## 2020-01-13 MED ORDER — DEXMEDETOMIDINE HCL 200 MCG/2ML IV SOLN
INTRAVENOUS | Status: DC | PRN
  Administered 2020-01-13: 20 ug via INTRAVENOUS

## 2020-01-13 MED ORDER — LIDOCAINE HCL (CARDIAC) PF 100 MG/5ML IV SOSY
PREFILLED_SYRINGE | INTRAVENOUS | Status: DC | PRN
  Administered 2020-01-13: 60 mg via INTRAVENOUS

## 2020-01-13 MED ORDER — PHENYLEPHRINE HCL (PRESSORS) 10 MG/ML IV SOLN
INTRAVENOUS | Status: DC | PRN
  Administered 2020-01-13 (×3): 100 ug via INTRAVENOUS

## 2020-01-13 NOTE — Anesthesia Preprocedure Evaluation (Signed)
Anesthesia Evaluation  Patient identified by MRN, date of birth, ID band Patient awake    Reviewed: Allergy & Precautions, NPO status , Patient's Chart, lab work & pertinent test results  History of Anesthesia Complications Negative for: history of anesthetic complications  Airway Mallampati: II  TM Distance: >3 FB Neck ROM: Full    Dental no notable dental hx. (+) Teeth Intact   Pulmonary neg pulmonary ROS, neg sleep apnea, neg COPD, Patient abstained from smoking.Not current smoker,    Pulmonary exam normal breath sounds clear to auscultation       Cardiovascular Exercise Tolerance: Good METShypertension, (-) CAD and (-) Past MI (-) dysrhythmias  Rhythm:Regular Rate:Normal - Systolic murmurs    Neuro/Psych negative neurological ROS  negative psych ROS   GI/Hepatic hiatal hernia, GERD  Poorly Controlled,(+)     (-) substance abuse  ,   Endo/Other  neg diabetes  Renal/GU negative Renal ROS     Musculoskeletal   Abdominal   Peds  Hematology   Anesthesia Other Findings Past Medical History: No date: Arthritis 05/03/2014: Essential hypertension No date: GERD (gastroesophageal reflux disease) No date: HTN (hypertension)     Comment:  stress test 08-04-2007 EF 70%; low risk scan , no               significant ischemia No date: Hyperlipemia No date: Murmur, cardiac     Comment:  2d-echo on 09-08-2012 EF  55-60% normal study, mild LVH,               no significant valve disease  Reproductive/Obstetrics                             Anesthesia Physical Anesthesia Plan  ASA: II  Anesthesia Plan: General   Post-op Pain Management:    Induction: Intravenous  PONV Risk Score and Plan: 3 and Ondansetron, Propofol infusion and TIVA  Airway Management Planned: Nasal Cannula  Additional Equipment: None  Intra-op Plan:   Post-operative Plan:   Informed Consent: I have reviewed the  patients History and Physical, chart, labs and discussed the procedure including the risks, benefits and alternatives for the proposed anesthesia with the patient or authorized representative who has indicated his/her understanding and acceptance.     Dental advisory given  Plan Discussed with: CRNA and Surgeon  Anesthesia Plan Comments: (Discussed risks of anesthesia with patient, including possibility of difficulty with spontaneous ventilation under anesthesia necessitating airway intervention, PONV, and rare risks such as cardiac or respiratory or neurological events. Patient understands.)        Anesthesia Quick Evaluation

## 2020-01-13 NOTE — Op Note (Signed)
[facility] Patient Name: Amy Cannon Procedure Date: 01/13/2020 9:12 AM MRN: [id number] Account #: [id number] Date of Birth: 1946/02/13 Admit Type: Outpatient Age: 74 Room: [facility] ENDO ROOM 1 Gender: Female Note Status: Finalized Procedure:             Upper GI endoscopy Indications:           Heartburn Providers:             Robert Bellow, MD Referring MD:          Asencion Noble (Referring MD) Medicines:             Monitored Anesthesia Care Complications:         No immediate complications. Procedure:             Pre-Anesthesia Assessment:                        - Prior to the procedure, a History and Physical was                         performed, and patient medications, allergies and                         sensitivities were reviewed. The patient's tolerance                         of previous anesthesia was reviewed.                        - The risks and benefits of the procedure and the                         sedation options and risks were discussed with the                         patient. All questions were answered and informed                         consent was obtained.                        After obtaining informed consent, the endoscope was                         passed under direct vision. Throughout the procedure,                         the patient's blood pressure, pulse, and oxygen                         saturations were monitored continuously. The Endoscope                         was introduced through the mouth, and advanced to the                         second part of duodenum. The upper GI endoscopy was                         accomplished without  difficulty. The patient tolerated                         the procedure well. Findings:      LA Grade B (one or more mucosal breaks greater than 5 mm, not extending       between the tops of two mucosal folds) esophagitis with no bleeding was       found 33 cm from the  incisors.      A medium-sized hiatal hernia was present. GEJ at 35 cm,, diaphragm       stikes the stomach at 40 cm.      The stomach was normal.      The examined duodenum was normal. Impression:            - LA Grade B reflux esophagitis with no bleeding.                        - Medium-sized hiatal hernia.                        - Normal stomach.                        - Normal examined duodenum.                        - No specimens collected. Recommendation:        - Perform a colonoscopy today. Procedure Code(s):     --- Professional ---                        [phone], Esophagogastroduodenoscopy, flexible,                         transoral; diagnostic, including collection of                         specimen(s) by brushing or washing, when performed                         (separate procedure) Diagnosis Code(s):     --- Professional ---                        K21.00                        K44.9, Diaphragmatic hernia without obstruction or                         gangrene                        R12, Heartburn CPT copyright 2019 American Medical Association. All rights reserved. The codes documented in this report are preliminary and upon coder review may  be revised to meet current compliance requirements. Robert Bellow, MD 01/13/2020 9:47:48 AM This report has been signed electronically. Number of Addenda: 0 Note Initiated On: 01/13/2020 9:12 AM Estimated Blood Loss:  Estimated blood loss: none.      [facility]

## 2020-01-13 NOTE — Op Note (Signed)
[facility] Patient Name: Amy Cannon Procedure Date: 01/13/2020 9:11 AM MRN: [id number] Account #: [id number] Date of Birth: 1945/07/23 Admit Type: Outpatient Age: 74 Room: [facility] ENDO ROOM 1 Gender: Female Note Status: Finalized Procedure:             Colonoscopy Indications:           High risk colon cancer surveillance: Personal history                         of colonic polyps Providers:             Robert Bellow, MD Referring MD:          Asencion Noble (Referring MD) Medicines:             Monitored Anesthesia Care Complications:         No immediate complications. Procedure:             Pre-Anesthesia Assessment:                        - Prior to the procedure, a History and Physical was                         performed, and patient medications, allergies and                         sensitivities were reviewed. The patient's tolerance                         of previous anesthesia was reviewed.                        - The risks and benefits of the procedure and the                         sedation options and risks were discussed with the                         patient. All questions were answered and informed                         consent was obtained.                        After obtaining informed consent, the colonoscope was                         passed under direct vision. Throughout the procedure,                         the patient's blood pressure, pulse, and oxygen                         saturations were monitored continuously. The was                         introduced through the anus and advanced to the the                         terminal ileum. The colonoscopy  was somewhat difficult                         due to significant looping. Successful completion of                         the procedure was aided by using manual pressure. The                         patient tolerated the procedure well. The quality of                          the bowel preparation was excellent. Findings:      A submucosal non-obstructing small mass was found at the appendiceal       orifice. The mass was non-circumferential. The mass measured one cm in       length. In addition, its diameter measured nine mm. No bleeding was       present. Biopsies were taken with a cold forceps for histology.      The retroflexed view of the distal rectum and anal verge was normal and       showed no anal or rectal abnormalities. 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                         Biopsied.                        - The distal rectum and anal verge are normal on                         retroflexion view. Recommendation:        - Return to endoscopist in 2 weeks. Procedure Code(s):     --- Professional ---                        [phone], Colonoscopy, flexible; with biopsy, single or                         multiple Diagnosis Code(s):     --- Professional ---                        Z86.010, Personal history of colonic polyps                        D49.0, Neoplasm of unspecified behavior of digestive                         system CPT copyright 2019 American Medical Association. All rights reserved. The codes documented in this report are preliminary and upon coder review may  be revised to meet current compliance requirements. Robert Bellow, MD 01/13/2020 10:16:34 AM This report has been signed electronically. Number of Addenda: 0 Note Initiated On: 01/13/2020 9:11 AM Scope Withdrawal Time: 0 hours 11 minutes 20 seconds  Total Procedure Duration: 0 hours 24 minutes 17 seconds  Estimated Blood Loss:  Estimated blood loss: none.      [facility]

## 2020-01-13 NOTE — Anesthesia Postprocedure Evaluation (Signed)
Anesthesia Post Note  Patient: Amy Cannon  Procedure(s) Performed: ESOPHAGOGASTRODUODENOSCOPY (EGD) WITH PROPOFOL (N/A ) COLONOSCOPY WITH PROPOFOL (N/A )  Patient location during evaluation: Endoscopy Anesthesia Type: General Level of consciousness: awake and alert Pain management: pain level controlled Vital Signs Assessment: post-procedure vital signs reviewed and stable Respiratory status: spontaneous breathing, nonlabored ventilation, respiratory function stable and patient connected to nasal cannula oxygen Cardiovascular status: blood pressure returned to baseline and stable Postop Assessment: no apparent nausea or vomiting Anesthetic complications: no   No complications documented.   Last Vitals:  Vitals:   01/13/20 1029 01/13/20 1039  BP: (!) 83/56 96/64  Pulse: 67 64  Resp: 17 14  Temp:    SpO2: 98% 99%    Last Pain:  Vitals:   01/13/20 1039  TempSrc:   PainSc: 0-No pain                 Arita Miss

## 2020-01-13 NOTE — H&P (Signed)
Stephens November [place], NP - 09/10/2019 2:30 PM EDT Formatting of this note is different from the original. PATIENT PROFILE: Amy Cannon is a 74 y.o. female who presents to the [place] department for consultation at the request of Dr. Pasty Arch for evaluation of gerd and concerns of hiatal hernia.  HISTORY OF PRESENT ILLNESS: Amy Cannon reports she is feeling well generally. Here today to discuss concerns of GERD/hiatal hernia. States she has hiatal hernia and feels like this makes her cough and hurt in the epigastric area that travels upwards frequently. States overeating, seedy products, wine, caffeine really bothers it, so she avoids these. Bending over trigger cough and discomfort. States no true dysphagia but feels like foods and sometimes liquids are slow to pass in her stomach Symptoms ongoing for over a year. Taking Pepcid 40mg  po bid over the 96m or so. Was taking pantoprazole but stopped due to concern of recent reports of possible side effects of PPIs. Supplements her H2RA with gaviscon.  States otherwise she is feeling well in her abdomen- denies any bowel habit changes, melena/hematochezia, and further GI concerns. Mother had colon cancer orginating in her appendix Last colonoscopy: 12/03/16- Dr Earlean Shawl, adenomatous polyp removed, requesting full report Last endoscopy: 11/2016- requesting this report Denies any problems with sedated procedures, MI/CVA/renal issues/DM.  GENERAL REVIEW OF SYSTEMS:   10 systems reviewed, unremarkable other than what is written above.  MEDICATIONS: Outpatient Encounter Medications as of 09/10/2019  Medication Sig Dispense Refill  . aspirin 81 MG EC tablet Take by mouth  . calcium carbonate/vitamin D3 (CALTRATE 600 + D ORAL) Take by mouth  . coenzyme Q10, bulk, Powd Take by mouth  . famotidine (PEPCID) 40 MG tablet  . grape seed extract (GRAPE SEED) 50 mg Cap Take by mouth  . irbesartan-hydrochlorothiazide (AVALIDE) 300-12.5 mg tablet  .  magnesium oxide 400 mg magnesium Take by mouth  . pantoprazole (PROTONIX) 40 MG DR tablet  . raloxifene (EVISTA) 60 mg tablet  . vitamin A 10000 UNIT capsule Take by mouth 3 (three) times a week  . vitamin B complex vit C no.3 (VITAMIN B COMP AND C NO.3 ORAL) Take by mouth   No facility-administered encounter medications on file as of 09/10/2019.   ALLERGIES: Patient has no allergy information on record.  PAST MEDICAL HISTORY: Past Medical History:  Diagnosis Date  . Arthritis  . GERD (gastroesophageal reflux disease)  . HTN (hypertension)   PAST SURGICAL HISTORY: Past Surgical History:  Procedure Laterality Date  . COLONOSCOPY  . COMPLEX REPAIR WRIST/HAND/FINGER  . TONSILLECTOMY  . UPPER GASTROINTESTINAL ENDOSCOPY    FAMILY HISTORY: Family History  Problem Relation Age of Onset  . Colon cancer Mother  . High blood pressure (Hypertension) Mother  . Coronary Artery Disease (Blocked arteries around heart) Father  . Prostate cancer Father  . Heart disease Brother    SOCIAL HISTORY: Social History   Socioeconomic History  . Marital status: Widowed  Spouse name: Not on file  . Number of children: Not on file  . Years of education: Not on file  . Highest education level: Not on file  Occupational History  . Not on file  Tobacco Use  . Smoking status: Never Smoker  . Smokeless tobacco: Never Used  Vaping Use  . Vaping Use: Never used  Substance and Sexual Activity  . Alcohol use: Not on file  . Drug use: Never  . Sexual activity: Not on file  Other Topics Concern  . Not on file  Social History Narrative  . Not on file   Social Determinants of Health   Financial Resource Strain:  . Difficulty of Paying Living Expenses:  Food Insecurity:  . Worried About Charity fundraiser in the Last Year:  . Arboriculturist in the Last Year:  Transportation Needs:  . Film/video editor (Medical):  Marland Kitchen Lack of Transportation (Non-Medical):   PHYSICAL EXAM: Vitals:   09/10/19 1435  BP: (!) 160/101  Pulse: 71  Resp: 18   Body mass index is 23.8 kg/m. Weight: 64.9 kg (143 lb)   General Appearance: Alert, cooperative, no distress, appears stated age  Head: Atraumatic, normocephalic  Eyes: Anciteric  Neck: Supple, symmetrical, trachea midline, no adenopathy, no thyroid enlargement; no JVD  Throat: Lips, mucosa, and tongue normal; teeth and gums normal  Lungs: Clear to auscultation bilaterally, respirations unlabored  Heart: Regular rate and rhythm, S1 and S2 normal, no murmur, rub or gallop  Abdomen: Soft, non-tender, bowel sounds active all four quadrants,  no masses, no organomegaly  Extremities: Extremities normal, atraumatic, no cyanosis or edema  Skin: Skin color, texture, turgor normal, no rashes or lesions  Neurologic: Grossly intact   REVIEW OF DATA: I have reviewed the following data today: Prior path from colonoscopy, outside GI notes  ASSESSMENT AND PLAN: Amy Cannon is a 74 y.o. female presenting for consultation for gerd/epigastric pain/cough/hiatal hernia concerns/history of adenomatous colon polyps. Schedule EGD and colonoscopy. Procedure information given: indications, benefits, risks- including, but not limited to bleeding, infection, perforation, difficulty with sedation, were discussed with the patient, and they are agreeable to the procedure. Do recommend Pantoprazole 40mg  po qd, explained how to take. Can continue with prn Gaviscon according to pack insert instructions. Continue lifestyle mods. Considering eval for hiatal hernia repair based on exam results/symptoms.  Do note elevated BP today- states she was rushing to get here and I ti usually normal. To recheck at home and f/u with PCP as indicated  Follow up a couple weeks after procedure, sooner if problems  Addendum: received endoscopy reports (Dr Earlean Shawl) EGD 12/03/16- La Grade B esophagitis, gastritis, CLO test negative. Large hiatal hernia. Colonoscopy 12/03/16- One  adenomatous polyp from the ascending colon. EGD 07/05/14- La grade b esophagitis, large hiatal hernia. Normal stomach and duodenum. Duodenal biopsies were taken and negative. Colonoscopy 10/22/11- hyperplastic polyp Colonoscopy 05/11/03- normal colonoscopy  Electronically signed by Ok Edwards, NP at 09/22/2019 3:41 PM EDT    Tolerated prep well.  Reports retrosternal discomfort/sensation at all times. Not worse with dietary intake.  Denies true dysphagia. Very rare "acid reflux".    Lungs: Clear. Cardio: RR.  Plan: EGD and colonoscopy.

## 2020-01-13 NOTE — Transfer of Care (Signed)
Immediate Anesthesia Transfer of Care Note  Patient: Shyla Mabry  Procedure(s) Performed: ESOPHAGOGASTRODUODENOSCOPY (EGD) WITH PROPOFOL (N/A ) COLONOSCOPY WITH PROPOFOL (N/A )  Patient Location: PACU and Endoscopy Unit  Anesthesia Type:General  Level of Consciousness: drowsy  Airway & Oxygen Therapy: Patient Spontanous Breathing  Post-op Assessment: Report given to RN and Post -op Vital signs reviewed and stable  Post vital signs: Reviewed and stable  Last Vitals:  Vitals Value Taken Time  BP 97/74 01/13/20 1020  Temp 36 C 01/13/20 1019  Pulse 52 01/13/20 1022  Resp 17 01/13/20 1022  SpO2 98 % 01/13/20 1022  Vitals shown include unvalidated device data.  Last Pain:  Vitals:   01/13/20 1019  TempSrc: Tympanic  PainSc: Asleep         Complications: No complications documented.

## 2020-01-14 ENCOUNTER — Encounter: Payer: Self-pay | Admitting: General Surgery

## 2020-01-14 ENCOUNTER — Other Ambulatory Visit: Payer: Self-pay | Admitting: General Surgery

## 2020-01-14 DIAGNOSIS — K388 Other specified diseases of appendix: Secondary | ICD-10-CM

## 2020-01-14 LAB — SURGICAL PATHOLOGY

## 2020-01-28 ENCOUNTER — Other Ambulatory Visit: Payer: Self-pay

## 2020-01-28 ENCOUNTER — Ambulatory Visit
Admission: RE | Admit: 2020-01-28 | Discharge: 2020-01-28 | Disposition: A | Discharge status: Home / Self Care | Payer: Medicare Other | Source: Ambulatory Visit | Attending: General Surgery | Admitting: General Surgery

## 2020-01-28 DIAGNOSIS — K388 Other specified diseases of appendix: Secondary | ICD-10-CM | POA: Diagnosis not present

## 2020-01-28 LAB — POCT I-STAT CREATININE: Creatinine, Ser: 0.8 mg/dL (ref 0.44–1.00)

## 2020-01-28 MED ORDER — IOHEXOL 300 MG/ML  SOLN
100.0000 mL | Freq: Once | INTRAMUSCULAR | Status: AC | PRN
  Administered 2020-01-28: 100 mL via INTRAVENOUS

## 2020-02-12 ENCOUNTER — Other Ambulatory Visit: Payer: Self-pay | Admitting: General Surgery

## 2020-02-12 NOTE — Progress Notes (Signed)
Subjective:     Patient ID: Amy Cannon is a 74 y.o. female.  HPI  The following portions of the patient's history were reviewed and updated as appropriate.  This an established patient is here today for: office visit. She is here to discuss CT scan results 01-28-20. She wants to discuss hiatal hernia and appendix surgery.   Review of Systems  Constitutional: Negative for chills and fever.  Respiratory: Negative for cough.         Chief Complaint  Patient presents with  . Follow-up    CT scan 01-28-20     BP 126/82   Pulse 76   Temp 36.6 C (97.8 F)   Ht 165.1 cm (5\' 5" )   Wt 66.2 kg (146 lb)   SpO2 95%   BMI 24.30 kg/m       Past Medical History:  Diagnosis Date  . Arthritis   . GERD (gastroesophageal reflux disease)   . HTN (hypertension)           Past Surgical History:  Procedure Laterality Date  . COLONOSCOPY    . COLONOSCOPY  01/13/2020   Normal colon biopsy/PHx CP/Scheduled for CT scan/JWB  . COMPLEX REPAIR WRIST/HAND/FINGER    . EGD  01/13/2020   Esophagitis/No Repeat/JWB  . TONSILLECTOMY    . UPPER GASTROINTESTINAL ENDOSCOPY                OB History    Gravida  0   Para  0   Term  0   Preterm  0   AB  0   Living  0     SAB  0   TAB  0   Ectopic  0   Molar  0   Multiple  0   Live Births  0       Obstetric Comments  Age at first period 33         Social History          Socioeconomic History  . Marital status: Widowed    Spouse name: Not on file  . Number of children: Not on file  . Years of education: Not on file  . Highest education level: Not on file  Occupational History  . Not on file  Tobacco Use  . Smoking status: Never Smoker  . Smokeless tobacco: Never Used  Vaping Use  . Vaping Use: Never used  Substance and Sexual Activity  . Alcohol use: Yes    Comment: socially  . Drug use: Never  . Sexual activity: Not on file  Other Topics Concern  . Not on file   Social History Narrative  . Not on file   Social Determinants of Health      Financial Resource Strain:   . Difficulty of Paying Living Expenses:   Food Insecurity:   . Worried About Charity fundraiser in the Last Year:   . Arboriculturist in the Last Year:   Transportation Needs:   . Film/video editor (Medical):   Marland Kitchen Lack of Transportation (Non-Medical):        No Known Allergies  Current Medications        Current Outpatient Medications  Medication Sig Dispense Refill  . aspirin 81 MG EC tablet Take by mouth    . calcium carbonate/vitamin D3 (CALTRATE 600 + D ORAL) Take by mouth    . coenzyme Q10, bulk, Powd Take by mouth    . famotidine (PEPCID) 40 MG tablet     .  grape seed extract (GRAPE SEED) 50 mg Cap Take by mouth    . irbesartan-hydrochlorothiazide (AVALIDE) 300-12.5 mg tablet     . magnesium oxide 400 mg magnesium Take by mouth    . pantoprazole (PROTONIX) 40 MG DR tablet     . raloxifene (EVISTA) 60 mg tablet     . vitamin A 10000 UNIT capsule Take by mouth 3 (three) times a week    . vitamin B complex vit C no.3 (VITAMIN B COMP AND C NO.3 ORAL) Take by mouth     No current facility-administered medications for this visit.           Family History  Problem Relation Age of Onset  . Colon cancer Mother 23       appendix  . High blood pressure (Hypertension) Mother   . Coronary Artery Disease (Blocked arteries around heart) Father   . Prostate cancer Father 56  . Heart disease Brother       Objective:   Physical Exam Constitutional:      Appearance: Normal appearance.  Cardiovascular:     Rate and Rhythm: Normal rate and regular rhythm.     Pulses: Normal pulses.     Heart sounds: Normal heart sounds.  Pulmonary:     Effort: Pulmonary effort is normal.     Breath sounds: Normal breath sounds.  Musculoskeletal:     Cervical back: Neck supple.  Skin:    General: Skin is warm and dry.  Neurological:      Mental Status: She is alert and oriented to person, place, and time.  Psychiatric:        Mood and Affect: Mood normal.        Behavior: Behavior normal.    Labs and Radiology:    CT scan of the abdomen and pelvis completed January 28, 2020 was independently reviewed.   The study was obtained after colonoscopy showed protrusion at the appendiceal orifice. Biopsies were negative. IMPRESSION: 1. Dilated fluid-filled appendix measuring 2.1 cm diameter. No inflammatory stranding changes around the appendix. 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 2. Large esophageal hiatal hernia. 3. Diffuse stool filling of the colon. No evidence of bowel obstruction. 4. Subcentimeter low-attenuation lesion in segment 7 of the liver, likely a small cyst or hemangioma.    Assessment:     Mucocele of appendix.  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    Plan:     Indications for appendectomy reviewed. Plans to resect the tip of the cecum with the specimen was discussed. Possibility for need for formal right colectomy if malignancy is identified was reviewed. No indication for right colectomy as initial procedure.  The patient has no history of dysphagia, but has persistent retrosternal chest discomfort which I think is related to her large hiatal hernia. Referral to especially physician post appendectomy is planned.     Entered by Karie Fetch, RN, acting as a scribe for Dr. Hervey Ard, MD.  The documentation recorded by the scribe accurately reflects the service I personally performed and the decisions made by me.   Robert Bellow, MD FACS

## 2020-03-23 ENCOUNTER — Inpatient Hospital Stay: Admission: RE | Admit: 2020-03-23 | Payer: Medicare Other | Source: Ambulatory Visit

## 2020-03-28 ENCOUNTER — Encounter
Admission: RE | Admit: 2020-03-28 | Discharge: 2020-03-28 | Disposition: A | Discharge status: Home / Self Care | Payer: Medicare Other | Source: Ambulatory Visit | Attending: General Surgery | Admitting: General Surgery

## 2020-03-28 ENCOUNTER — Other Ambulatory Visit: Payer: Medicare Other

## 2020-03-28 ENCOUNTER — Other Ambulatory Visit: Payer: Self-pay

## 2020-03-28 DIAGNOSIS — Z01818 Encounter for other preprocedural examination: Secondary | ICD-10-CM | POA: Insufficient documentation

## 2020-03-28 DIAGNOSIS — I10 Essential (primary) hypertension: Secondary | ICD-10-CM | POA: Diagnosis not present

## 2020-03-28 DIAGNOSIS — Z20822 Contact with and (suspected) exposure to covid-19: Secondary | ICD-10-CM | POA: Diagnosis not present

## 2020-03-28 HISTORY — DX: Personal history of other diseases of the digestive system: Z87.19

## 2020-03-28 HISTORY — DX: Malignant (primary) neoplasm, unspecified: C80.1

## 2020-03-28 NOTE — Patient Instructions (Addendum)
Your procedure is scheduled on: 04-01-20 FRIDAY Report to the Registration Desk on the 1st floor of the [facility]. To find out your arrival time, please call [phone] between 1PM - 3PM on:03-31-20 THURSDAY  REMEMBER: Instructions that are not followed completely may result in serious medical risk, up to and including death; or upon the discretion of your surgeon and anesthesiologist your surgery may need to be rescheduled.  Do not eat food after midnight the night before surgery.  No gum chewing, lozengers or hard candies.  You may however, drink CLEAR liquids up to 2 hours before you are scheduled to arrive for your surgery. Do not drink anything within 2 hours of your scheduled arrival time.  Clear liquids include: - water  - apple juice without pulp - gatorade (not RED, PURPLE, OR BLUE) - black coffee or tea (Do NOT add milk or creamers to the coffee or tea) Do NOT drink anything that is not on this list.  TAKE THESE MEDICATIONS THE MORNING OF SURGERY WITH A SIP OF WATER:  FAMOTIDINE (PEPCID)-take one the night before and one on the morning of surgery - helps to prevent nausea after surgery.) .  Follow recommendations from Cardiologist, Pulmonologist or PCP regarding stopping Aspirin, Coumadin, Plavix, Eliquis, Pradaxa, or Pletal-STOP YOUR ASPIRIN NOW  One week prior to surgery: Stop Anti-inflammatories (NSAIDS) such as Advil, Aleve, Ibuprofen, Motrin, Naproxen, Naprosyn and Aspirin based products such as Excedrin, Goodys Powder, BC Powder-OK TO TAKE TYLENOL IF NEEDED  Stop ANY OVER THE COUNTER supplements until after surgery-STOP ALPHA LIPOIC ACID, B COMPLEX, CO Q-10, VITAMIN A, OLIVE LEAF EXTRACT, BONE COLLAGENIZER, AND RESERVATROL-YOU MAY RESUME THESE AFTER YOUR SURGERY (However, you may continue taking magnesium and multivitamin.)  No Alcohol for 24 hours before or after surgery.  No Smoking including e-cigarettes for 24 hours prior to surgery.  No chewable tobacco  products for at least 6 hours prior to surgery.  No nicotine patches on the day of surgery.  Do not use any "recreational" drugs for at least a week prior to your surgery.  Please be advised that the combination of cocaine and anesthesia may have negative outcomes, up to and including death. If you test positive for cocaine, your surgery will be cancelled.  On the morning of surgery brush your teeth with toothpaste and water, you may rinse your mouth with mouthwash if you wish. Do not swallow any toothpaste or mouthwash.  Do not wear jewelry, make-up, hairpins, clips or nail polish.  Do not wear lotions, powders, or perfumes.   Do not shave 48 hours prior to surgery.   Contact lenses, hearing aids and dentures may not be worn into surgery.  Do not bring valuables to the hospital. [facility] is not responsible for any missing/lost belongings or valuables.   Use CHG Soap as directed on instruction sheet.  Notify your doctor if there is any change in your medical condition (cold, fever, infection).  Wear comfortable clothing (specific to your surgery type) to the hospital.  Plan for stool softeners for home use; pain medications have a tendency to cause constipation. You can also help prevent constipation by eating foods high in fiber such as fruits and vegetables and drinking plenty of fluids as your diet allows.  After surgery, you can help prevent lung complications by doing breathing exercises.  Take deep breaths and cough every 1-2 hours. Your doctor may order a device called an Incentive Spirometer to help you take deep breaths. When coughing or sneezing,  hold a pillow firmly against your incision with both hands. This is called "splinting." Doing this helps protect your incision. It also decreases belly discomfort.  If you are being admitted to the hospital overnight, leave your suitcase in the car. After surgery it may be brought to your room.  If you are being discharged  the day of surgery, you will not be allowed to drive home. You will need a responsible adult (18 years or older) to drive you home and stay with you that night.   If you are taking public transportation, you will need to have a responsible adult (18 years or older) with you. Please confirm with your physician that it is acceptable to use public transportation.   Please call the [facility] at [phone] if you have any questions about these instructions.  Visitation Policy:  Patients undergoing a surgery or procedure may have one family member or support person with them as long as that person is not COVID-19 positive or experiencing its symptoms.  That person may remain in the waiting area during the procedure.  Inpatient Visitation Update:   In an effort to ensure the safety of our team members and our patients, we are implementing a change to our visitation policy:  Effective Monday, Aug. 9, at 7 a.m., inpatients will be allowed one support person.  o The support person may change daily.  o The support person must pass our screening, gel in and out, and wear a mask at all times, including in the patient's room.  o Patients must also wear a mask when staff or their support person are in the room.  o Masking is required regardless of vaccination status.  Systemwide, no visitors 17 or younger.

## 2020-03-30 ENCOUNTER — Other Ambulatory Visit: Payer: Self-pay

## 2020-03-30 ENCOUNTER — Other Ambulatory Visit: Payer: Medicare Other

## 2020-03-30 ENCOUNTER — Encounter
Admission: RE | Admit: 2020-03-30 | Discharge: 2020-03-30 | Disposition: A | Discharge status: Home / Self Care | Payer: Medicare Other | Source: Ambulatory Visit | Attending: General Surgery | Admitting: General Surgery

## 2020-03-30 DIAGNOSIS — Z01818 Encounter for other preprocedural examination: Secondary | ICD-10-CM | POA: Diagnosis not present

## 2020-03-30 LAB — POTASSIUM: Potassium: 4.2 mmol/L (ref 3.5–5.1)

## 2020-03-31 LAB — SARS CORONAVIRUS 2 (TAT 6-24 HRS): SARS Coronavirus 2: NEGATIVE

## 2020-03-31 MED ORDER — SODIUM CHLORIDE 0.9 % IV SOLN
1.0000 g | INTRAVENOUS | Status: AC
  Administered 2020-04-01: 1 g via INTRAVENOUS
  Filled 2020-03-31: qty 1

## 2020-04-01 ENCOUNTER — Ambulatory Visit
Admission: RE | Admit: 2020-04-01 | Discharge: 2020-04-01 | Disposition: A | Discharge status: Home / Self Care | Payer: Medicare Other | Attending: General Surgery | Admitting: General Surgery

## 2020-04-01 ENCOUNTER — Other Ambulatory Visit: Payer: Self-pay

## 2020-04-01 ENCOUNTER — Ambulatory Visit: Payer: Medicare Other | Admitting: Certified Registered"

## 2020-04-01 ENCOUNTER — Encounter
Admission: RE | Disposition: A | Discharge status: Home / Self Care | Payer: Self-pay | Source: Home / Self Care | Attending: General Surgery

## 2020-04-01 ENCOUNTER — Encounter: Payer: Self-pay | Admitting: General Surgery

## 2020-04-01 DIAGNOSIS — Z87891 Personal history of nicotine dependence: Secondary | ICD-10-CM | POA: Insufficient documentation

## 2020-04-01 DIAGNOSIS — Z888 Allergy status to other drugs, medicaments and biological substances status: Secondary | ICD-10-CM | POA: Insufficient documentation

## 2020-04-01 DIAGNOSIS — Z79899 Other long term (current) drug therapy: Secondary | ICD-10-CM | POA: Insufficient documentation

## 2020-04-01 DIAGNOSIS — K388 Other specified diseases of appendix: Secondary | ICD-10-CM | POA: Diagnosis present

## 2020-04-01 DIAGNOSIS — K668 Other specified disorders of peritoneum: Secondary | ICD-10-CM | POA: Diagnosis not present

## 2020-04-01 DIAGNOSIS — D373 Neoplasm of uncertain behavior of appendix: Secondary | ICD-10-CM | POA: Diagnosis not present

## 2020-04-01 DIAGNOSIS — Z7982 Long term (current) use of aspirin: Secondary | ICD-10-CM | POA: Insufficient documentation

## 2020-04-01 HISTORY — PX: LAPAROSCOPIC APPENDECTOMY: SHX408

## 2020-04-01 SURGERY — APPENDECTOMY, LAPAROSCOPIC
Anesthesia: General

## 2020-04-01 MED ORDER — DROPERIDOL 2.5 MG/ML IJ SOLN
0.6250 mg | Freq: Once | INTRAMUSCULAR | Status: DC | PRN
  Filled 2020-04-01: qty 2

## 2020-04-01 MED ORDER — ONDANSETRON HCL 4 MG/2ML IJ SOLN
INTRAMUSCULAR | Status: AC
  Filled 2020-04-01: qty 2

## 2020-04-01 MED ORDER — HYDROMORPHONE HCL 1 MG/ML IJ SOLN: 0.2500 mg | INTRAMUSCULAR | Status: DC | PRN

## 2020-04-01 MED ORDER — LIDOCAINE HCL (PF) 2 % IJ SOLN
INTRAMUSCULAR | Status: AC
  Filled 2020-04-01: qty 5

## 2020-04-01 MED ORDER — ROCURONIUM BROMIDE 10 MG/ML (PF) SYRINGE
PREFILLED_SYRINGE | INTRAVENOUS | Status: AC
  Filled 2020-04-01: qty 10

## 2020-04-01 MED ORDER — KETOROLAC TROMETHAMINE 30 MG/ML IJ SOLN
INTRAMUSCULAR | Status: DC | PRN
  Administered 2020-04-01: 30 mg via INTRAVENOUS

## 2020-04-01 MED ORDER — CHLORHEXIDINE GLUCONATE 0.12 % MT SOLN: 15.0000 mL | Freq: Once | OROMUCOSAL | Status: AC

## 2020-04-01 MED ORDER — CHLORHEXIDINE GLUCONATE CLOTH 2 % EX PADS
6.0000 | MEDICATED_PAD | Freq: Once | CUTANEOUS | Status: AC
  Administered 2020-04-01: 6 via TOPICAL

## 2020-04-01 MED ORDER — LIDOCAINE HCL (CARDIAC) PF 100 MG/5ML IV SOSY
PREFILLED_SYRINGE | INTRAVENOUS | Status: DC | PRN
  Administered 2020-04-01: 50 mg via INTRAVENOUS

## 2020-04-01 MED ORDER — PROPOFOL 10 MG/ML IV BOLUS
INTRAVENOUS | Status: AC
  Filled 2020-04-01: qty 20

## 2020-04-01 MED ORDER — ACETAMINOPHEN 10 MG/ML IV SOLN
INTRAVENOUS | Status: DC | PRN
  Administered 2020-04-01: 1000 mg via INTRAVENOUS

## 2020-04-01 MED ORDER — ROCURONIUM BROMIDE 100 MG/10ML IV SOLN
INTRAVENOUS | Status: DC | PRN
  Administered 2020-04-01: 80 mg via INTRAVENOUS

## 2020-04-01 MED ORDER — HYDROCODONE-ACETAMINOPHEN 5-325 MG PO TABS: 1.0000 | ORAL_TABLET | ORAL | 0 refills | Status: DC | PRN

## 2020-04-01 MED ORDER — CHLORHEXIDINE GLUCONATE 0.12 % MT SOLN
OROMUCOSAL | Status: AC
  Administered 2020-04-01: 15 mL via OROMUCOSAL
  Filled 2020-04-01: qty 15

## 2020-04-01 MED ORDER — FENTANYL CITRATE (PF) 100 MCG/2ML IJ SOLN
INTRAMUSCULAR | Status: AC
  Filled 2020-04-01: qty 2

## 2020-04-01 MED ORDER — SUGAMMADEX SODIUM 200 MG/2ML IV SOLN
INTRAVENOUS | Status: DC | PRN
  Administered 2020-04-01: 254 mg via INTRAVENOUS

## 2020-04-01 MED ORDER — ONDANSETRON HCL 4 MG/2ML IJ SOLN
INTRAMUSCULAR | Status: DC | PRN
  Administered 2020-04-01: 4 mg via INTRAVENOUS

## 2020-04-01 MED ORDER — LACTATED RINGERS IV SOLN: INTRAVENOUS | Status: DC

## 2020-04-01 MED ORDER — LORAZEPAM 2 MG/ML IJ SOLN: 1.0000 mg | Freq: Once | INTRAMUSCULAR | Status: DC | PRN

## 2020-04-01 MED ORDER — PROMETHAZINE HCL 25 MG/ML IJ SOLN: 6.2500 mg | INTRAMUSCULAR | Status: DC | PRN

## 2020-04-01 MED ORDER — OXYCODONE HCL 5 MG PO TABS: 5.0000 mg | ORAL_TABLET | Freq: Once | ORAL | Status: DC | PRN

## 2020-04-01 MED ORDER — OXYCODONE HCL 5 MG/5ML PO SOLN: 5.0000 mg | Freq: Once | ORAL | Status: DC | PRN

## 2020-04-01 MED ORDER — FENTANYL CITRATE (PF) 100 MCG/2ML IJ SOLN
INTRAMUSCULAR | Status: DC | PRN
  Administered 2020-04-01: 100 ug via INTRAVENOUS

## 2020-04-01 MED ORDER — ORAL CARE MOUTH RINSE: 15.0000 mL | Freq: Once | OROMUCOSAL | Status: AC

## 2020-04-01 MED ORDER — PROPOFOL 10 MG/ML IV BOLUS
INTRAVENOUS | Status: DC | PRN
  Administered 2020-04-01: 200 mg via INTRAVENOUS

## 2020-04-01 MED ORDER — MEPERIDINE HCL 50 MG/ML IJ SOLN: 6.2500 mg | INTRAMUSCULAR | Status: DC | PRN

## 2020-04-01 MED ORDER — ACETAMINOPHEN 10 MG/ML IV SOLN
INTRAVENOUS | Status: AC
  Filled 2020-04-01: qty 100

## 2020-04-01 MED ORDER — DEXAMETHASONE SODIUM PHOSPHATE 10 MG/ML IJ SOLN
INTRAMUSCULAR | Status: DC | PRN
  Administered 2020-04-01: 10 mg via INTRAVENOUS

## 2020-04-01 MED ORDER — KETOROLAC TROMETHAMINE 30 MG/ML IJ SOLN
INTRAMUSCULAR | Status: AC
  Filled 2020-04-01: qty 1

## 2020-04-01 SURGICAL SUPPLY — 43 items
APL PRP STRL LF DISP 70% ISPRP (MISCELLANEOUS) ×1
BLADE SURG 11 STRL SS SAFETY (MISCELLANEOUS) ×3 IMPLANT
CANISTER SUCT 1200ML W/VALVE (MISCELLANEOUS) ×3 IMPLANT
CANNULA DILATOR 10 W/SLV (CANNULA) ×2 IMPLANT
CANNULA DILATOR 10MM W/SLV (CANNULA) ×1
CHLORAPREP W/TINT 26 (MISCELLANEOUS) ×3 IMPLANT
CLOSURE WOUND 1/2 X4 (GAUZE/BANDAGES/DRESSINGS) ×1
COVER WAND RF STERILE (DRAPES) ×3 IMPLANT
CUTTER FLEX LINEAR 45M (STAPLE) ×3 IMPLANT
DRSG TEGADERM 2-3/8X2-3/4 SM (GAUZE/BANDAGES/DRESSINGS) ×9 IMPLANT
DRSG TELFA 4X3 1S NADH ST (GAUZE/BANDAGES/DRESSINGS) ×3 IMPLANT
ELECT REM PT RETURN 9FT ADLT (ELECTROSURGICAL) ×3
ELECTRODE REM PT RTRN 9FT ADLT (ELECTROSURGICAL) ×1 IMPLANT
GLOVE BIO SURGEON STRL SZ7.5 (GLOVE) ×3 IMPLANT
GLOVE INDICATOR 8.0 STRL GRN (GLOVE) ×3 IMPLANT
GOWN STRL REUS W/ TWL LRG LVL3 (GOWN DISPOSABLE) ×2 IMPLANT
GOWN STRL REUS W/TWL LRG LVL3 (GOWN DISPOSABLE) ×6
GRASPER SUT TROCAR 14GX15 (MISCELLANEOUS) ×3 IMPLANT
IRRIGATION STRYKERFLOW (MISCELLANEOUS) ×1 IMPLANT
IRRIGATOR STRYKERFLOW (MISCELLANEOUS) ×3
IV LACTATED RINGERS 1000ML (IV SOLUTION) IMPLANT
KIT TURNOVER KIT A (KITS) ×3 IMPLANT
LABEL OR SOLS (LABEL) ×3 IMPLANT
LIGASURE LAP MARYLAND 5MM 37CM (ELECTROSURGICAL) ×2 IMPLANT
MANIFOLD NEPTUNE II (INSTRUMENTS) ×3 IMPLANT
NDL INSUFF ACCESS 14 VERSASTEP (NEEDLE) ×3 IMPLANT
NEEDLE HYPO 22GX1.5 SAFETY (NEEDLE) ×3 IMPLANT
NS IRRIG 1000ML POUR BTL (IV SOLUTION) IMPLANT
PACK LAP CHOLECYSTECTOMY (MISCELLANEOUS) ×3 IMPLANT
POUCH ENDO CATCH 10MM SPEC (MISCELLANEOUS) ×3 IMPLANT
RELOAD 45 VASCULAR/THIN (ENDOMECHANICALS) ×6 IMPLANT
RELOAD STAPLE 45 2.5 WHT GRN (ENDOMECHANICALS) ×1 IMPLANT
RELOAD STAPLE 45 3.5 BLU ETS (ENDOMECHANICALS) ×2 IMPLANT
RELOAD STAPLE TA45 3.5 REG BLU (ENDOMECHANICALS) ×6 IMPLANT
SET TUBE SMOKE EVAC HIGH FLOW (TUBING) ×3 IMPLANT
STRIP CLOSURE SKIN 1/2X4 (GAUZE/BANDAGES/DRESSINGS) ×2 IMPLANT
SUT VIC AB 0 CT2 27 (SUTURE) ×3 IMPLANT
SUT VIC AB 4-0 FS2 27 (SUTURE) ×3 IMPLANT
SWABSTK COMLB BENZOIN TINCTURE (MISCELLANEOUS) ×3 IMPLANT
SYR 10ML LL (SYRINGE) IMPLANT
TRAY FOLEY MTR SLVR 16FR STAT (SET/KITS/TRAYS/PACK) IMPLANT
TROCAR XCEL 12X100 BLDLESS (ENDOMECHANICALS) ×3 IMPLANT
TROCAR XCEL NON-BLD 11X100MML (ENDOMECHANICALS) ×3 IMPLANT

## 2020-04-01 NOTE — Anesthesia Preprocedure Evaluation (Signed)
Anesthesia Evaluation  Patient identified by MRN, date of birth, ID band Patient awake    Reviewed: Allergy & Precautions, NPO status , Patient's Chart, lab work & pertinent test results  Airway Mallampati: II  TM Distance: >3 FB Neck ROM: Full    Dental no notable dental hx.    Pulmonary neg pulmonary ROS, former smoker,    breath sounds clear to auscultation       Cardiovascular hypertension, + Valvular Problems/Murmurs  Rhythm:Regular Rate:Normal + Systolic murmurs    Neuro/Psych negative neurological ROS  negative psych ROS   GI/Hepatic Neg liver ROS, hiatal hernia, GERD  ,  Endo/Other  negative endocrine ROS  Renal/GU negative Renal ROS  negative genitourinary   Musculoskeletal  (+) Arthritis ,   Abdominal   Peds negative pediatric ROS (+)  Hematology negative hematology ROS (+)   Anesthesia Other Findings .Marland KitchenPast Medical History: No date: Arthritis No date: Cancer [facility])     Comment:  BASAL CELL SKIN CANCER 05/03/2014: Essential hypertension No date: GERD (gastroesophageal reflux disease) No date: History of hiatal hernia     Comment:  LARGE No date: HTN (hypertension)     Comment:  stress test 08-04-2007 EF 70%; low risk scan , no               significant ischemia No date: Hyperlipemia No date: Murmur, cardiac     Comment:  2d-echo on 09-08-2012 EF  55-60% normal study, mild LVH,               no significant valve disease   Reproductive/Obstetrics negative OB ROS                             Anesthesia Physical Anesthesia Plan  ASA: II  Anesthesia Plan: General   Post-op Pain Management:    Induction: Intravenous  PONV Risk Score and Plan: 3 and Ondansetron  Airway Management Planned: Oral ETT  Additional Equipment: None  Intra-op Plan:   Post-operative Plan:   Informed Consent: I have reviewed the patients History and Physical, chart, labs and discussed the  procedure including the risks, benefits and alternatives for the proposed anesthesia with the patient or authorized representative who has indicated his/her understanding and acceptance.       Plan Discussed with: CRNA, Anesthesiologist and Surgeon  Anesthesia Plan Comments:         Anesthesia Quick Evaluation

## 2020-04-01 NOTE — Anesthesia Postprocedure Evaluation (Signed)
Anesthesia Post Note  Patient: Amy Cannon  Procedure(s) Performed: APPENDECTOMY LAPAROSCOPIC (N/A )  Patient location during evaluation: PACU Anesthesia Type: General Level of consciousness: awake and awake and alert Pain management: pain level controlled Vital Signs Assessment: post-procedure vital signs reviewed and stable Respiratory status: spontaneous breathing and nonlabored ventilation Cardiovascular status: blood pressure returned to baseline Postop Assessment: no apparent nausea or vomiting Anesthetic complications: yes   No complications documented.   Last Vitals:  Vitals:   04/01/20 1307  BP: (!) 158/98  Pulse: 67  Resp: 16  Temp: 36.9 C  SpO2: 100%    Last Pain:  Vitals:   04/01/20 1307  TempSrc: Oral  PainSc: 0-No pain                 Neva Seat

## 2020-04-01 NOTE — Anesthesia Procedure Notes (Signed)
Procedure Name: Intubation Performed by: Telma Pyeatt R, CRNA Pre-anesthesia Checklist: Patient identified, Emergency Drugs available, Suction available and Patient being monitored Patient Re-evaluated:Patient Re-evaluated prior to induction Oxygen Delivery Method: Circle system utilized Preoxygenation: Pre-oxygenation with 100% oxygen Induction Type: IV induction Ventilation: Mask ventilation without difficulty and Oral airway inserted - appropriate to patient size Laryngoscope Size: Mac and 3 Grade View: Grade I Tube type: Oral Tube size: 7.0 mm Number of attempts: 1 Airway Equipment and Method: Oral airway Placement Confirmation: ETT inserted through vocal cords under direct vision,  positive ETCO2 and breath sounds checked- equal and bilateral Secured at: 21 cm Tube secured with: Tape Dental Injury: Teeth and Oropharynx as per pre-operative assessment        

## 2020-04-01 NOTE — Discharge Instructions (Signed)

## 2020-04-01 NOTE — Op Note (Signed)
Preoperative diagnosis: Mucocele of the appendix.  Postoperative diagnosis: Same.  Operative procedure: Resection of the appendix and cecum.  Operating surgeon: Hervey Ard, MD.  Anesthesia: General endotracheal, Marcaine 0.5%, plain, 30 cc local infiltration.  Estimated blood loss: Less than 5 cc.  Clinical note: This 74 year old woman underwent a screening colonoscopy and was found to have bulging mucosa at the appendiceal orifice.  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  She is felt to be a candidate for appendectomy and resection of the cecum to obtain clear margins.  The patient received Invanz prior to the procedure.  SCD stockings for DVT prevention.  Operative note: With the patient under adequate general endotracheal anesthesia the abdomen was cleansed with ChloraPrep and draped.  In Trendelenburg position a varies needle was placed with transumbilical incision.  After assuring intra-abdominal location with a hanging drop test the abdomen was insufflated with CO2 at 10 mmHg pressure.  A 10 mm Neck step port was expanded.  No evidence of injury from initial port placement.  An 11 mm XL port was placed in the hypogastrium under direct vision.  A 12 mm XL port was placed outside the rectus sheath in the left lower quadrant.  The mucocele of the appendix was identified.  Making use of the LigaSure device the antimesenteric sale of the terminal ileum was cleared to allow clear visualization of the junction of the terminal ileum and the cecum.  The cecum was then mobilized laterally to provide free exposure.  A powered blue Echelon stapler, 60 mm in length was then applied.  After closure there was about a 5 mm area where staples were present but the tissue had not been cut.  This was covered with a blue 45 mm stapler for reinforcement.  The appendix was placed into an Endo Catch bag and then delivered to the umbilical port site.  The fascial incision was  extended about 4 mm to allow easy extraction without rupture of the appendix.  No spillage during the procedure.  The umbilical fascia was approximated with a 0 Maxon figure-of-eight suture followed by replacement of the 10 mm Neck step port and reestablishment of pneumoperitoneum.  Inspection of the right lower quadrant showed excellent hemostasis.  The stomach was viewed and found to be markedly distended with air.  The nurse anesthetist carefully Plast an NG tube and decompress the stomach prior to awakening the patient.  The lateral port site was closed with a 0 Maxon transfascial suture making use of the "cone" and PMI suture passer.  The hypogastric port site was closed with a 0 Vicryl suture in a similar technique.  Final inspection was unremarkable.  Good hemostasis at the lateral port site.  The abdomen was then desufflated and the final port removed.  Skin incisions were closed with 4-0 Vicryl subcuticular suture.  Benzoin, Steri-Strips, Telfa and Tegaderm dressings were applied.  The patient tolerated the procedure well and was taken to the PACU in stable condition.

## 2020-04-01 NOTE — H&P (Signed)
Amy Cannon [id number] [birth year]     HPI:  74 y/o identified with a mucocele of the appendix.  For resection of the cecum and appendix.    Medications Prior to Admission  Medication Sig Dispense Refill Last Dose  . Alpha-Lipoic Acid 100 MG CAPS Take 100 mg by mouth daily.   Past Week at Unknown time  . aspirin 81 MG tablet Take 81 mg by mouth every evening.      . B Complex Vitamins (VITAMIN-B COMPLEX PO) Take 1 tablet by mouth daily.    Past Week at Unknown time  . Beta Carotene (VITAMIN A) 25000 UNIT capsule Take 25,000 Units by mouth every Monday, Wednesday, and Friday.   Past Week at Unknown time  . Carboxymethylcellulose Sodium (REFRESH TEARS OP) Place 1 drop into both eyes daily as needed (dry eyes).     . Coenzyme Q10 (COQ-10) 100 MG CAPS Take 100 mg by mouth daily.   Past Week at Unknown time  . famotidine (PEPCID) 40 MG tablet Take 40 mg by mouth See admin instructions. AM-Take 40 mg daily, may take a second 40 mg dose in the evening as needed for heartburn   04/01/2020 at Unknown time  . irbesartan-hydrochlorothiazide (AVALIDE) 300-12.5 MG tablet Take 1 tablet by mouth daily. Please schedule appointment for refills. (Patient taking differently: Take 1 tablet by mouth every evening. Please schedule appointment for refills.) 30 tablet 0 03/31/2020 at Unknown time  . Magnesium 400 MG CAPS Take 400 mg by mouth 2 (two) times daily.   Past Week at Unknown time  . Multiple Vitamin (MULTIVITAMIN WITH MINERALS) TABS tablet Take 1 tablet by mouth daily.   Past Week at Unknown time  . Olive Leaf Extract 250 MG CAPS Take 250 mg by mouth 2 (two) times daily.   Past Week at Unknown time  . OVER THE COUNTER MEDICATION Take 1 tablet by mouth daily. Bone Collagenizer otc supplement   Past Week at Unknown time  . pantoprazole (PROTONIX) 40 MG tablet Take 40 mg by mouth daily.   03/31/2020 at Unknown time  . raloxifene (EVISTA) 60 MG tablet Take 60 mg by mouth every evening.    03/31/2020 at Unknown  time  . RESVERATROL PO Take 1 tablet by mouth daily.     . sucralfate (CARAFATE) 1 g tablet Take 1 g by mouth 3 (three) times daily as needed (hernia pain).    03/31/2020 at Unknown time  . tretinoin (RETIN-A) 0.025 % cream Apply 1 application topically at bedtime.   03/31/2020 at Unknown time  . Flaxseed, Linseed, (FLAXSEED OIL PO) Take 2 capsules by mouth daily. (Patient not taking: Reported on 04/01/2020)   Not Taking at Unknown time  . loperamide (IMODIUM A-D) 2 MG tablet Take 4 mg by mouth 4 (four) times daily as needed for diarrhea or loose stools. (Patient not taking: Reported on 03/28/2020)   Not Taking at Unknown time   Allergies  Allergen Reactions  . Lisinopril Cough   Past Medical History:  Diagnosis Date  . Arthritis   . Cancer (HCC)    BASAL CELL SKIN CANCER  . Essential hypertension 05/03/2014  . GERD (gastroesophageal reflux disease)   . History of hiatal hernia    LARGE  . HTN (hypertension)    stress test 08-04-2007 EF 70%; low risk scan , no significant ischemia  . Hyperlipemia   . Murmur, cardiac    2d-echo on 09-08-2012 EF  55-60% normal study, mild LVH, no significant valve disease  Past Surgical History:  Procedure Laterality Date  . COLONOSCOPY    . COLONOSCOPY WITH PROPOFOL N/A 01/13/2020   Procedure: COLONOSCOPY WITH PROPOFOL;  Surgeon: Robert Bellow, MD;  Location: [facility] ENDOSCOPY;  Service: Endoscopy;  Laterality: N/A;  . complex repair wrist/hand/finger    . ESOPHAGOGASTRODUODENOSCOPY    . ESOPHAGOGASTRODUODENOSCOPY (EGD) WITH PROPOFOL N/A 01/13/2020   Procedure: ESOPHAGOGASTRODUODENOSCOPY (EGD) WITH PROPOFOL;  Surgeon: Robert Bellow, MD;  Location: [facility] ENDOSCOPY;  Service: Endoscopy;  Laterality: N/A;  . TONSILLECTOMY     Social History   Socioeconomic History  . Marital status: Widowed    Spouse name: Not on file  . Number of children: Not on file  . Years of education: Not on file  . Highest education level: Not on file  Occupational  History  . Not on file  Tobacco Use  . Smoking status: Former Research scientist (life sciences)  . Smokeless tobacco: Never Used  Vaping Use  . Vaping Use: Never used  Substance and Sexual Activity  . Alcohol use: Yes    Alcohol/week: 5.0 standard drinks    Types: 5 Standard drinks or equivalent per week    Comment: SOCIALLY  . Drug use: No  . Sexual activity: Not on file  Other Topics Concern  . Not on file  Social History Narrative  . Not on file   Social Determinants of Health   Financial Resource Strain:   . Difficulty of Paying Living Expenses: Not on file  Food Insecurity:   . Worried About Charity fundraiser in the Last Year: Not on file  . Ran Out of Food in the Last Year: Not on file  Transportation Needs:   . Lack of Transportation (Medical): Not on file  . Lack of Transportation (Non-Medical): Not on file  Physical Activity:   . Days of Exercise per Week: Not on file  . Minutes of Exercise per Session: Not on file  Stress:   . Feeling of Stress : Not on file  Social Connections:   . Frequency of Communication with Friends and Family: Not on file  . Frequency of Social Gatherings with Friends and Family: Not on file  . Attends Religious Services: Not on file  . Active Member of Clubs or Organizations: Not on file  . Attends Archivist Meetings: Not on file  . Marital Status: Not on file  Intimate Partner Violence:   . Fear of Current or Ex-Partner: Not on file  . Emotionally Abused: Not on file  . Physically Abused: Not on file  . Sexually Abused: Not on file   Social History   Social History Narrative  . Not on file     ROS: Negative.     PE: HEENT: Negative. Lungs: Clear. Cardio: RR.   Assessment/Plan:  Proceed with planned appendectomy with resection of the cecum.     Forest Gleason [facility] 04/01/2020

## 2020-04-01 NOTE — Transfer of Care (Signed)
Immediate Anesthesia Transfer of Care Note  Patient: Amy Cannon  Procedure(s) Performed: Procedure(s): APPENDECTOMY LAPAROSCOPIC (N/A)  Patient Location: PACU  Anesthesia Type:General  Level of Consciousness: sedated  Airway & Oxygen Therapy: Patient Spontanous Breathing and Patient connected to face mask oxygen  Post-op Assessment: Report given to RN and Post -op Vital signs reviewed and stable  Post vital signs: Reviewed and stable  Last Vitals:  Vitals:   04/01/20 1536 04/01/20 1541  BP: (!) 154/93 (!) 154/93  Pulse: (P) 64 62  Resp: 16 16  Temp: 36.6 C   SpO2: 707% 867%    Complications: No apparent anesthesia complications

## 2020-04-04 ENCOUNTER — Encounter: Payer: Self-pay | Admitting: General Surgery

## 2020-04-06 LAB — SURGICAL PATHOLOGY

## 2020-07-22 ENCOUNTER — Telehealth: Payer: Self-pay | Admitting: Gastroenterology

## 2020-07-25 NOTE — Telephone Encounter (Signed)
Hey Dr Bryan Lemma, this pt is requesting to establish gi care with you since she has a hiatal hernia, pt saw [facility] 08/2019, records are in epic for review, please advise on scheduling.

## 2020-07-26 NOTE — Telephone Encounter (Signed)
No problem, I would be happy to see her in clinic.  I saw that she was seen by Dr. Maryjane Hurter, Cardiothoracic surgery at [facility], with previous recommendation for hiatal hernia repair/fundoplication.  I am happy to see her as a second opinion or to establish care to discuss antireflux surgical options/hernia repair locally.  Please set up OV with me next available.  Thanks.

## 2020-07-27 ENCOUNTER — Encounter: Payer: Self-pay | Admitting: Gastroenterology

## 2020-07-27 NOTE — Telephone Encounter (Signed)
Unable to reach patient, Amy Cannon few messages were left to contact the office. Another message has been left for patient to contact our office to schedule an appointment with Dr Bryan Lemma. The office phone number has been provided.

## 2020-08-05 ENCOUNTER — Other Ambulatory Visit: Payer: Self-pay

## 2020-08-05 ENCOUNTER — Ambulatory Visit (INDEPENDENT_AMBULATORY_CARE_PROVIDER_SITE_OTHER): Payer: Medicare Other | Admitting: Cardiovascular Disease

## 2020-08-05 ENCOUNTER — Encounter: Payer: Self-pay | Admitting: Cardiovascular Disease

## 2020-08-05 VITALS — BP 139/88 | HR 63 | Ht 65.0 in | Wt 142.0 lb

## 2020-08-05 DIAGNOSIS — E7800 Pure hypercholesterolemia, unspecified: Secondary | ICD-10-CM

## 2020-08-05 DIAGNOSIS — K219 Gastro-esophageal reflux disease without esophagitis: Secondary | ICD-10-CM | POA: Diagnosis not present

## 2020-08-05 DIAGNOSIS — I10 Essential (primary) hypertension: Secondary | ICD-10-CM

## 2020-08-05 NOTE — Progress Notes (Signed)
Cardiology Office Note:    Date:  08/08/2020   ID:  Amy Cannon, DOB 1946-03-16, MRN [id number]  PCP:  Asencion Noble, MD   [place]  Cardiologist:  Sanda Klein, MD [facility] Advanced Practice Provider:  No care team member to display Electrophysiologist:  None       Referring MD: Asencion Noble, MD   Chief Complaint  Patient presents with  . Hypertension    History of Present Illness:    Amy Cannon is a 75 y.o. female with a hx of hypertension, hypercholesterolemia, large hiatal hernia with GERD, reported history of cardiac murmur without significant valvular abnormalities on echocardiography, returning for routine follow-up  She has had some problems with chest pain, but after taking twice daily proton pump inhibitor as well as an H2 blocker her symptoms have completely resolved.  She just does not like taking chronic PPI therapy due to concerns with long-term side effects.  Recently had an appendectomy without complications.  The patient specifically denies any chest pain with exertion, dyspnea at rest or with exertion, orthopnea, paroxysmal nocturnal dyspnea, syncope, palpitations, focal neurological deficits, intermittent claudication, lower extremity edema, unexplained weight gain, cough, hemoptysis or wheezing.  Very intense last few weeks as she is preparing the regional fox hunt.  Rides horses daily.  Past Medical History:  Diagnosis Date  . Arthritis   . Cancer (HCC)    BASAL CELL SKIN CANCER  . Essential hypertension 05/03/2014  . GERD (gastroesophageal reflux disease)   . History of hiatal hernia    LARGE  . HTN (hypertension)    stress test 08-04-2007 EF 70%; low risk scan , no significant ischemia  . Hyperlipemia   . Murmur, cardiac    2d-echo on 09-08-2012 EF  55-60% normal study, mild LVH, no significant valve disease    Past Surgical History:  Procedure Laterality Date  . COLONOSCOPY    . COLONOSCOPY WITH PROPOFOL N/A 01/13/2020    Procedure: COLONOSCOPY WITH PROPOFOL;  Surgeon: Robert Bellow, MD;  Location: [facility] ENDOSCOPY;  Service: Endoscopy;  Laterality: N/A;  . complex repair wrist/hand/finger    . ESOPHAGOGASTRODUODENOSCOPY    . ESOPHAGOGASTRODUODENOSCOPY (EGD) WITH PROPOFOL N/A 01/13/2020   Procedure: ESOPHAGOGASTRODUODENOSCOPY (EGD) WITH PROPOFOL;  Surgeon: Robert Bellow, MD;  Location: [facility] ENDOSCOPY;  Service: Endoscopy;  Laterality: N/A;  . LAPAROSCOPIC APPENDECTOMY N/A 04/01/2020   Procedure: APPENDECTOMY LAPAROSCOPIC;  Surgeon: Robert Bellow, MD;  Location: [facility] ORS;  Service: General;  Laterality: N/A;  . TONSILLECTOMY      Current Medications: Current Meds  Medication Sig  . Alpha-Lipoic Acid 100 MG CAPS Take 100 mg by mouth daily.  Marland Kitchen aspirin 81 MG tablet Take 81 mg by mouth every evening.   . B Complex Vitamins (VITAMIN-B COMPLEX PO) Take 1 tablet by mouth daily.   . Beta Carotene (VITAMIN A) 25000 UNIT capsule Take 25,000 Units by mouth every Monday, Wednesday, and Friday.  . Carboxymethylcellulose Sodium (REFRESH TEARS OP) Place 1 drop into both eyes daily as needed (dry eyes).  . Coenzyme Q10 (COQ-10) 100 MG CAPS Take 100 mg by mouth daily.  . famotidine (PEPCID) 40 MG tablet Take 40 mg by mouth See admin instructions. AM-Take 40 mg daily, may take a second 40 mg dose in the evening as needed for heartburn  . Flaxseed, Linseed, (FLAXSEED OIL PO) Take 2 capsules by mouth daily.  . irbesartan-hydrochlorothiazide (AVALIDE) 300-12.5 MG tablet Take 1 tablet by mouth daily. Please schedule appointment for refills. (Patient  taking differently: Take 1 tablet by mouth every evening. Please schedule appointment for refills.)  . loperamide (IMODIUM A-D) 2 MG tablet Take 4 mg by mouth 4 (four) times daily as needed for diarrhea or loose stools.  . Magnesium 400 MG CAPS Take 400 mg by mouth 2 (two) times daily.  . Multiple Vitamin (MULTIVITAMIN WITH MINERALS) TABS tablet Take 1 tablet by mouth daily.   Jonna Coup Leaf Extract 250 MG CAPS Take 250 mg by mouth 2 (two) times daily.  Marland Kitchen OVER THE COUNTER MEDICATION Take 1 tablet by mouth daily. Bone Collagenizer otc supplement  . pantoprazole (PROTONIX) 40 MG tablet Take 40 mg by mouth daily.  . raloxifene (EVISTA) 60 MG tablet Take 60 mg by mouth every evening.   Marland Kitchen RESVERATROL PO Take 1 tablet by mouth daily.  . sucralfate (CARAFATE) 1 g tablet Take 1 g by mouth 3 (three) times daily as needed (hernia pain).   Marland Kitchen tretinoin (RETIN-A) 0.025 % cream Apply 1 application topically at bedtime.  . [DISCONTINUED] HYDROcodone-acetaminophen (NORCO/VICODIN) 5-325 MG tablet Take 1 tablet by mouth every 4 (four) hours as needed for moderate pain.     Allergies:   Lisinopril   Social History   Socioeconomic History  . Marital status: Widowed    Spouse name: Not on file  . Number of children: Not on file  . Years of education: Not on file  . Highest education level: Not on file  Occupational History  . Not on file  Tobacco Use  . Smoking status: Former Research scientist (life sciences)  . Smokeless tobacco: Never Used  Vaping Use  . Vaping Use: Never used  Substance and Sexual Activity  . Alcohol use: Yes    Alcohol/week: 5.0 standard drinks    Types: 5 Standard drinks or equivalent per week    Comment: SOCIALLY  . Drug use: No  . Sexual activity: Not on file  Other Topics Concern  . Not on file  Social History Narrative  . Not on file   Social Determinants of Health   Financial Resource Strain: Not on file  Food Insecurity: Not on file  Transportation Needs: Not on file  Physical Activity: Not on file  Stress: Not on file  Social Connections: Not on file     Family History: The patient's family history includes CAD in an other family member.  ROS:   Please see the history of present illness.     All other systems reviewed and are negative.  EKGs/Labs/Other Studies Reviewed:    The following studies were reviewed today:   EKG:  EKG is ordered today.   The ekg ordered today demonstrates sinus rhythm, normal tracing  Recent Labs: 01/28/2020: Creatinine, Ser 0.80 03/30/2020: Potassium 4.2  Recent Lipid Panel    Component Value Date/Time   CHOL 217 (H) 05/01/2017 1039   TRIG 111 05/01/2017 1039   HDL 70 05/01/2017 1039   CHOLHDL 2.2 02/16/2013 1014   VLDL 15 02/16/2013 1014   LDLCALC 125 (H) 05/01/2017 1039   07/28/2019 HDL 77, total cholesterol 220, triglycerides 124  Risk Assessment/Calculations:       Physical Exam:    VS:  BP 139/88   Pulse 63   Ht 5\' 5"  (1.651 m)   Wt 142 lb (64.4 kg)   SpO2 99%   BMI 23.63 kg/m     Wt Readings from Last 3 Encounters:  08/05/20 142 lb (64.4 kg)  04/01/20 140 lb (63.5 kg)  01/13/20 140 lb (63.5 kg)  GEN: Lean and fit, appears younger than stated age, well nourished, well developed in no acute distress HEENT: Normal NECK: No JVD; No carotid bruits LYMPHATICS: No lymphadenopathy CARDIAC: RRR, no murmurs, rubs, gallops RESPIRATORY:  Clear to auscultation without rales, wheezing or rhonchi  ABDOMEN: Soft, non-tender, non-distended MUSCULOSKELETAL:  No edema; No deformity  SKIN: Warm and dry NEUROLOGIC:  Alert and oriented x 3 PSYCHIATRIC:  Normal affect   ASSESSMENT:    1. Gastroesophageal reflux disease, unspecified whether esophagitis present   2. Essential hypertension   3. Hypercholesterolemia    PLAN:    In order of problems listed above:  1. Chest pain/GERD: Occurred at rest in a pattern consistent with GI etiology and has resolved after intensive treatment with twice daily PPI plus H2 blocker, highly consistent with esophageal reflux disease.  Not have exertional chest pain.  Normal ECG.  History of normal stress test in 2014.  No additional coronary work-up appears necessary at this time. 2. HTN: Well-controlled on current medications. 3. HLP: She has mildly elevated total cholesterol, but also excellent HDL cholesterol and no known CAD or PAD.  Reasonable to  continue with conservative management.  Reviewed the importance of a diet low in saturated fat.  She is quite lean.  Continue physical activity.        Medication Adjustments/Labs and Tests Ordered: Current medicines are reviewed at length with the patient today.  Concerns regarding medicines are outlined above.  Orders Placed This Encounter  Procedures  . EKG 12-Lead   No orders of the defined types were placed in this encounter.   Patient Instructions  Medication Instructions:  No changes *If you need a refill on your cardiac medications before your next appointment, please call your pharmacy*   Lab Work: None ordered If you have labs (blood work) drawn today and your tests are completely normal, you will receive your results only by: Marland Kitchen MyChart Message (if you have MyChart) OR . A paper copy in the mail If you have any lab test that is abnormal or we need to change your treatment, we will call you to review the results.   Testing/Procedures: None ordered   Follow-Up: At [facility], you and your health needs are our priority.  As part of our continuing mission to provide you with exceptional heart care, we have created designated Provider Care Teams.  These Care Teams include your primary Cardiologist (physician) and Advanced Practice Providers (APPs -  Physician Assistants and Nurse Practitioners) who all work together to provide you with the care you need, when you need it.  We recommend signing up for the patient portal called "MyChart".  Sign up information is provided on this After Visit Summary.  MyChart is used to connect with patients for Virtual Visits (Telemedicine).  Patients are able to view lab/test results, encounter notes, upcoming appointments, etc.  Non-urgent messages can be sent to your provider as well.   To learn more about what you can do with MyChart, go to [email or web address].    Your next appointment:   12 month(s)  The format for your next  appointment:   In Person  Provider:   You may see Sanda Klein, MD or one of the following Advanced Practice Providers on your designated Care Team:    Almyra Deforest, PA-C  Fabian Sharp, [place] or   Roby Lofts, PA-C     Signed, Sanda Klein, MD  08/08/2020 7:25 PM    [place]

## 2020-08-05 NOTE — Patient Instructions (Signed)

## 2020-08-08 ENCOUNTER — Encounter: Payer: Self-pay | Admitting: Cardiovascular Disease

## 2020-08-24 ENCOUNTER — Encounter: Payer: Self-pay | Admitting: Gastroenterology

## 2020-08-24 ENCOUNTER — Ambulatory Visit (INDEPENDENT_AMBULATORY_CARE_PROVIDER_SITE_OTHER): Payer: Medicare Other | Admitting: Gastroenterology

## 2020-08-24 ENCOUNTER — Other Ambulatory Visit: Payer: Self-pay

## 2020-08-24 VITALS — BP 142/92 | HR 72 | Ht 65.5 in | Wt 147.2 lb

## 2020-08-24 DIAGNOSIS — K2100 Gastro-esophageal reflux disease with esophagitis, without bleeding: Secondary | ICD-10-CM | POA: Diagnosis not present

## 2020-08-24 DIAGNOSIS — K449 Diaphragmatic hernia without obstruction or gangrene: Secondary | ICD-10-CM

## 2020-08-24 NOTE — Patient Instructions (Signed)
If you are age 73 or older, your body mass index should be between 23-30. Your Body mass index is 24.13 kg/m. If this is out of the aforementioned range listed, please consider follow up with your Primary Care Provider.  If you are age 71 or younger, your body mass index should be between 19-25. Your Body mass index is 24.13 kg/m. If this is out of the aformentioned range listed, please consider follow up with your Primary Care Provider.    It has been recommended to you by your physician that you have a(n) EGD completed. Per your request, we did not schedule the procedure(s) today. Please contact our office at [phone] should you decide to have the procedure completed.  We are sending a referral to [facility] for a consultation with Dr. Greer Pickerel. They will contact you to schedule.  Due to recent changes in healthcare laws, you may see the results of your imaging and laboratory studies on MyChart before your provider has had a chance to review them.  We understand that in some cases there may be results that are confusing or concerning to you. Not all laboratory results come back in the same time frame and the provider may be waiting for multiple results in order to interpret others.  Please give [place] 48 hours in order for your provider to thoroughly review all the results before contacting the office for clarification of your results.   Thank you for choosing me and [facility].  Vito Cirigliano, D.O.

## 2020-08-24 NOTE — Progress Notes (Signed)
Chief Complaint: GERD, hiatal hernia  Referring Provider:     Asencion Noble, MD   HPI:     Amy Cannon is a 75 y.o. female with a history of HTN, BCC, HLD, osteoporosis, referred to the Gastroenterology Clinic for evaluation of GERD and large hiatal hernia.  Has a longstanding history of reflux. Index sxs infrequent HB, regurgitation, but most bothersome is chronic, dry cough. No dysphagia. Takes Protonix 40 mg bid and Pepcid 40 mg qhs.  Avoids eating close to bedtime, sleeps with HOB elevated. CT 01/2020 with large hiatal hernia.   Has subsequently stopped taking Protonix due to fear of side effect profile.  She was previously evaluated by Dr. Maryjane Hurter at [facility] in 2021, with recommendation at that time for hiatal hernia repair/fundoplication.  Was seen again by Dr. Maryjane Hurter 07/19/2020, and again recommended hiatal hernia repair with partial fundoplication  Also recently seen in the Cardiology Clinic been diagnosed with noncardiac chest pain 2/2 GERD.  Normal EKG.  Normal stress test 2014.  No plan for additional cardiac work-up.  Colonoscopy 12/2019 with submucosal, nonobstructing mass at the appendiceal orifice.  Subsequently underwent appendectomy/cecectomy by Dr. 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   Endoscopic History: -EGD (12/2019, Dr. Bary Castilla): LA Grade B esophagitis, 5 cm HH, normal stomach/duodenum -Colonoscopy (12/2019, Dr. Bary Castilla): Submucosal, nonobstructing mass at [facility] measuring 10 mm x 9 mm (path: Benign polypoid mucosa)  -CT abdomen/pelvis (01/2021): Appendiceal mucocele, large hiatal hernia, small hepatic cyst or hemangioma -Barium swallow (07/19/2020): No stricture, moderate hiatal hernia, normal motility.  Significant inducible gastroesophageal reflux to the level of lower cervical spine.  Swallow 13 mm barium tablet without issue.  Unremarkable stomach/duodenum   Past Medical History:  Diagnosis Date  . Arthritis   . Cancer  (HCC)    BASAL CELL SKIN CANCER  . Essential hypertension 05/03/2014  . GERD (gastroesophageal reflux disease)   . History of hiatal hernia    LARGE  . HTN (hypertension)    stress test 08-04-2007 EF 70%; low risk scan , no significant ischemia  . Hyperlipemia   . Murmur, cardiac    2d-echo on 09-08-2012 EF  55-60% normal study, mild LVH, no significant valve disease     Past Surgical History:  Procedure Laterality Date  . COLONOSCOPY    . COLONOSCOPY WITH PROPOFOL N/A 01/13/2020   Procedure: COLONOSCOPY WITH PROPOFOL;  Surgeon: Robert Bellow, MD;  Location: [facility] ENDOSCOPY;  Service: Endoscopy;  Laterality: N/A;  . complex repair wrist/hand/finger    . ESOPHAGOGASTRODUODENOSCOPY    . ESOPHAGOGASTRODUODENOSCOPY (EGD) WITH PROPOFOL N/A 01/13/2020   Procedure: ESOPHAGOGASTRODUODENOSCOPY (EGD) WITH PROPOFOL;  Surgeon: Robert Bellow, MD;  Location: [facility] ENDOSCOPY;  Service: Endoscopy;  Laterality: N/A;  . LAPAROSCOPIC APPENDECTOMY N/A 04/01/2020   Procedure: APPENDECTOMY LAPAROSCOPIC;  Surgeon: Robert Bellow, MD;  Location: [facility] ORS;  Service: General;  Laterality: N/A;  . TONSILLECTOMY     Family History  Problem Relation Age of Onset  . CAD Other   . Cancer Mother        appendectomy and spreaded to colon  . Esophageal cancer Neg Hx    Social History   Tobacco Use  . Smoking status: Former Research scientist (life sciences)  . Smokeless tobacco: Never Used  Vaping Use  . Vaping Use: Never used  Substance Use Topics  . Alcohol use: Yes    Alcohol/week: 5.0 standard drinks    Types: 5  Standard drinks or equivalent per week    Comment: SOCIALLY  . Drug use: No   Current Outpatient Medications  Medication Sig Dispense Refill  . Alpha-Lipoic Acid 100 MG CAPS Take 100 mg by mouth daily.    Marland Kitchen aspirin 81 MG tablet Take 81 mg by mouth every evening.     . B Complex Vitamins (VITAMIN-B COMPLEX PO) Take 1 tablet by mouth daily.     . Beta Carotene (VITAMIN A) 25000 UNIT capsule Take 25,000  Units by mouth every Monday, Wednesday, and Friday.    . Carboxymethylcellulose Sodium (REFRESH TEARS OP) Place 1 drop into both eyes daily as needed (dry eyes).    . Coenzyme Q10 (COQ-10) 100 MG CAPS Take 100 mg by mouth daily.    . famotidine (PEPCID) 40 MG tablet Take 40 mg by mouth at bedtime.    . Flaxseed, Linseed, (FLAXSEED OIL PO) Take 2 capsules by mouth daily as needed.    . irbesartan-hydrochlorothiazide (AVALIDE) 300-12.5 MG tablet Take 1 tablet by mouth daily. Please schedule appointment for refills. (Patient taking differently: Take 1 tablet by mouth at bedtime. Please schedule appointment for refills.) 30 tablet 0  . loperamide (IMODIUM A-D) 2 MG tablet Take 2-4 mg by mouth as needed for diarrhea or loose stools.    . Magnesium 400 MG CAPS Take 400 mg by mouth 2 (two) times daily.    . Multiple Vitamin (MULTIVITAMIN WITH MINERALS) TABS tablet Take 1 tablet by mouth daily.    Jonna Coup Leaf Extract 250 MG CAPS Take 250 mg by mouth 2 (two) times daily.    Marland Kitchen OVER THE COUNTER MEDICATION Take 1 tablet by mouth daily. Bone Collagenizer otc supplement Vitamin C supplement    . pantoprazole (PROTONIX) 40 MG tablet Take 40 mg by mouth 2 (two) times daily.    . raloxifene (EVISTA) 60 MG tablet Take 60 mg by mouth every evening.     Marland Kitchen RESVERATROL PO Take 1 tablet by mouth daily. Vitamin supplement    . tretinoin (RETIN-A) 0.025 % cream Apply 1 application topically at bedtime.    . diphenoxylate-atropine (LOMOTIL) 2.5-0.025 MG tablet Take 1 tablet by mouth 3 (three) times daily as needed. (Patient not taking: Reported on 08/24/2020)     No current facility-administered medications for this visit.   Allergies  Allergen Reactions  . Lisinopril Cough     Review of Systems: All systems reviewed and negative except where noted in HPI.     Physical Exam:    Wt Readings from Last 3 Encounters:  08/24/20 147 lb 4 oz (66.8 kg)  08/05/20 142 lb (64.4 kg)  04/01/20 140 lb (63.5 kg)    BP  (!) 142/92   Pulse 72   Ht 5' 5.5" (1.664 m)   Wt 147 lb 4 oz (66.8 kg)   BMI 24.13 kg/m  Constitutional:  Pleasant, in no acute distress. Psychiatric: Normal mood and affect. Behavior is normal.  Neurological: Alert and oriented to person place and time. Skin: Skin is warm and dry. No rashes noted.   ASSESSMENT AND PLAN;   1) GERD with erosive esophagitis 2) Large hiatal hernia 75 year old female with a longstanding history of reflux, particularly bothered by the chronic, nonproductive cough.  Reflux complicated by erosive esophagitis and a large hiatal hernia.  She would like to undergo hiatal hernia repair and antireflux surgery.  We discussed that at length today, to include laparoscopic hiatal hernia repair with TIF (cTIF).  We discussed alternatives, to  include Nissen fundoplication, LINX, Stretta, etc. and she would like to proceed with cTIF.  She otherwise has clear objective evidence of reflux on her prior endoscopy and UGI series.  -EGD now to assess for resolution of previous erosive esophagitis, and rule out high-grade esophagitis that needs to be managed medically first prior to antireflux surgery -Referral to Dr. Redmond Pulling at [place] for consideration of concomitant hiatal hernia repair and TIF -Continue PPI and H2 RA as currently doing -Continue antireflux lifestyle/dietary modifications  The indications, risks, and benefits of EGD were explained to the patient in detail. Risks include but are not limited to bleeding, perforation, adverse reaction to medications, and cardiopulmonary compromise. Sequelae include but are not limited to the possibility of surgery, hospitalization, and mortality. The patient verbalized understanding and wished to proceed. All questions answered, referred to scheduler. Further recommendations pending results of the exam.     [name], DO, FACG  08/24/2020, 11:11 AM   Asencion Noble, MD

## 2020-08-31 ENCOUNTER — Ambulatory Visit (AMBULATORY_SURGERY_CENTER): Payer: Medicare Other | Admitting: *Deleted

## 2020-08-31 ENCOUNTER — Other Ambulatory Visit: Payer: Self-pay

## 2020-08-31 VITALS — Ht 65.5 in | Wt 147.0 lb

## 2020-08-31 DIAGNOSIS — K2100 Gastro-esophageal reflux disease with esophagitis, without bleeding: Secondary | ICD-10-CM

## 2020-08-31 NOTE — Progress Notes (Signed)
Patient's pre-visit was done today over the phone with the patient due to COVID-19 pandemic. Name,DOB and address verified. Insurance verified. Patient denies any allergies to Eggs and Soy. Patient denies any problems with anesthesia/sedation. Patient denies taking diet pills or blood thinners. Packet of Prep instructions mailed to patient including a copy of a consent form-pt is aware. Patient understands to call us back with any questions or concerns. Patient is aware of our care-partner policy and MOLMB-86 safety protocol. EMMI education assigned to the patient for the procedure, sent to [place]. Patient denies any medical hx changes since last GI OV.

## 2020-09-13 ENCOUNTER — Ambulatory Visit (AMBULATORY_SURGERY_CENTER): Payer: Medicare Other | Admitting: Gastroenterology

## 2020-09-13 ENCOUNTER — Other Ambulatory Visit: Payer: Self-pay

## 2020-09-13 ENCOUNTER — Encounter: Payer: Self-pay | Admitting: Gastroenterology

## 2020-09-13 VITALS — BP 141/82 | HR 65 | Temp 96.8°F | Resp 13 | Ht 65.5 in | Wt 147.0 lb

## 2020-09-13 DIAGNOSIS — K2100 Gastro-esophageal reflux disease with esophagitis, without bleeding: Secondary | ICD-10-CM

## 2020-09-13 DIAGNOSIS — K449 Diaphragmatic hernia without obstruction or gangrene: Secondary | ICD-10-CM | POA: Diagnosis not present

## 2020-09-13 MED ORDER — SODIUM CHLORIDE 0.9 % IV SOLN: 500.0000 mL | Freq: Once | INTRAVENOUS | Status: DC

## 2020-09-13 NOTE — Progress Notes (Signed)
VS by KW  Pt's states no medical or surgical changes since previsit or office visit.  

## 2020-09-13 NOTE — Progress Notes (Signed)
pt tolerated well. VSS. awake and to recovery. Report given to RN. Bite block left insitu to recovery. 

## 2020-09-13 NOTE — Op Note (Signed)
[facility] Patient Name: Amy Cannon Procedure Date: 09/13/2020 2:34 PM MRN: [id number] Endoscopist: Gerrit Heck , MD Age: 75 Referring MD:  Date of Birth: 02/06/1946 Gender: Female Account #: [id number] Procedure:                Upper GI endoscopy Indications:              Follow-up of reflux esophagitis, Hiatal hernia,                            Preoperative assessment for hiatal hernia repair                            and antireflux surgery Medicines:                Monitored Anesthesia Care Procedure:                Pre-Anesthesia Assessment:                           - Prior to the procedure, a History and Physical                            was performed, and patient medications and                            allergies were reviewed. The patient's tolerance of                            previous anesthesia was also reviewed. The risks                            and benefits of the procedure and the sedation                            options and risks were discussed with the patient.                            All questions were answered, and informed consent                            was obtained. Prior Anticoagulants: The patient has                            taken no previous anticoagulant or antiplatelet                            agents. ASA Grade Assessment: II - A patient with                            mild systemic disease. After reviewing the risks                            and benefits, the patient was deemed in  satisfactory condition to undergo the procedure.                           After obtaining informed consent, the endoscope was                            passed under direct vision. Throughout the                            procedure, the patient's blood pressure, pulse, and                            oxygen saturations were monitored continuously. The                            Endoscope was introduced through the  mouth, and                            advanced to the third part of duodenum. The upper                            GI endoscopy was accomplished without difficulty.                            The patient tolerated the procedure well. Scope In: Scope Out: Findings:                 LA Grade B (one or more mucosal breaks greater than                            5 mm, not extending between the tops of two mucosal                            folds) esophagitis with no bleeding was found 36 cm                            from the incisors.                           A 5 cm hiatal hernia was present.                           Esophagogastric landmarks were identified: the                            gastroesophageal junction was found at 36 cm and                            the site of hiatal narrowing was found at 41 cm                            from the incisors.  The gastroesophageal flap valve was visualized                            endoscopically and classified as Hill Grade IV (no                            fold, wide open lumen, hiatal hernia present).                           The entire examined stomach was normal.                           The duodenal bulb, first portion of the duodenum                            and second portion of the duodenum were normal. Complications:            No immediate complications. Estimated Blood Loss:     Estimated blood loss: none. Impression:               - LA Grade B reflux esophagitis with no bleeding.                           - 5 cm hiatal hernia.                           - Esophagogastric landmarks identified.                           - Gastroesophageal flap valve classified as Hill                            Grade IV (no fold, wide open lumen, hiatal hernia                            present).                           - Normal stomach.                           - Normal duodenal bulb, first portion of the                             duodenum and second portion of the duodenum.                           - No specimens collected. Recommendation:           - Patient has a contact number available for                            emergencies. The signs and symptoms of potential                            delayed complications were discussed with the  patient. Return to normal activities tomorrow.                            Written discharge instructions were provided to the                            patient.                           - Resume previous diet.                           - Continue present medications.                           - Based on these findings and evaluation to date,                            will plan to proceed with concommitant hiatal                            hernia repair and Transoral Incisionless                            Fundoplication (cTIF). Plan for evaluation by Dr.                            Redmond Pulling at [facility] along with follow-up appointment with                            Dr. Bryan Lemma. Gerrit Heck, MD 09/13/2020 3:26:06 PM

## 2020-09-13 NOTE — Patient Instructions (Signed)
YOU HAD AN ENDOSCOPIC PROCEDURE TODAY AT THE [facility]:   Refer to the procedure report that was given to you for any specific questions about what was found during the examination.  If the procedure report does not answer your questions, please call your gastroenterologist to clarify.  If you requested that your care partner not be given the details of your procedure findings, then the procedure report has been included in a sealed envelope for you to review at your convenience later.  YOU SHOULD EXPECT: Some feelings of bloating in the abdomen. Passage of more gas than usual.  Walking can help get rid of the air that was put into your GI tract during the procedure and reduce the bloating. If you had a lower endoscopy (such as a colonoscopy or flexible sigmoidoscopy) you may notice spotting of blood in your stool or on the toilet paper. If you underwent a bowel prep for your procedure, you may not have a normal bowel movement for a few days.  Please Note:  You might notice some irritation and congestion in your nose or some drainage.  This is from the oxygen used during your procedure.  There is no need for concern and it should clear up in a day or so.  SYMPTOMS TO REPORT IMMEDIATELY:    Following upper endoscopy (EGD)  Vomiting of blood or coffee ground material  New chest pain or pain under the shoulder blades  Painful or persistently difficult swallowing  New shortness of breath  Fever of 100F or higher  Black, tarry-looking stools  For urgent or emergent issues, a gastroenterologist can be reached at any hour by calling [phone]. Do not use MyChart messaging for urgent concerns.    DIET:  We do recommend a small meal at first, but then you may proceed to your regular diet.  Drink plenty of fluids but you should avoid alcoholic beverages for 24 hours.  ACTIVITY:  You should plan to take it easy for the rest of today and you should NOT DRIVE or use heavy machinery  until tomorrow (because of the sedation medicines used during the test).    FOLLOW UP: Our staff will call the number listed on your records 48-72 hours following your procedure to check on you and address any questions or concerns that you may have regarding the information given to you following your procedure. If we do not reach you, we will leave a message.  We will attempt to reach you two times.  During this call, we will ask if you have developed any symptoms of COVID 19. If you develop any symptoms (ie: fever, flu-like symptoms, shortness of breath, cough etc.) before then, please call [phone].  If you test positive for Covid 19 in the 2 weeks post procedure, please call and report this information to us.    If any biopsies were taken you will be contacted by phone or by letter within the next 1-3 weeks.  Please call us at [phone] if you have not heard about the biopsies in 3 weeks.    SIGNATURES/CONFIDENTIALITY: You and/or your care partner have signed paperwork which will be entered into your electronic medical record.  These signatures attest to the fact that that the information above on your After Visit Summary has been reviewed and is understood.  Full responsibility of the confidentiality of this discharge information lies with you and/or your care-partner. 

## 2020-09-15 ENCOUNTER — Telehealth: Payer: Self-pay | Admitting: *Deleted

## 2020-09-15 NOTE — Telephone Encounter (Signed)
  Follow up Call-  Call back number 09/13/2020  Post procedure Call Back phone  # [phone]  Permission to leave phone message Yes  Some recent data might be hidden     Patient questions:  Do you have a fever, pain , or abdominal swelling? No. Pain Score  0 *  Have you tolerated food without any problems? Yes.    Have you been able to return to your normal activities? Yes.    Do you have any questions about your discharge instructions: Diet   No. Medications  No. Follow up visit  No.  Do you have questions or concerns about your Care? No.  Actions: * If pain score is 4 or above: No action needed, pain <4

## 2020-09-19 ENCOUNTER — Telehealth: Payer: Self-pay

## 2020-09-19 NOTE — Telephone Encounter (Signed)
Will wait for Amy Cannon to contact me to set up appointment after her visit with Dr. Redmond Pulling

## 2020-09-19 NOTE — Telephone Encounter (Addendum)
-----   Message from [name], DO sent at 09/13/2020  4:22 PM EDT ----- This patient has an appt with Dr. Redmond Pulling next week to discuss cTIF. Just completed EGD today, and good to move forward with cTIF once seen by him. Any chance we can tentatively get her on for that 6/16 date that we have 2 others scheduled for?

## 2020-09-21 NOTE — Telephone Encounter (Signed)
Patient called in. Wanted to let you know she is going to [place] 5/10-5/25. She is available for the surgery any time after 6/8. Best contact number [phone] to go ahead and get it schedule if can.

## 2020-09-22 NOTE — Telephone Encounter (Signed)
Amy Cannon spoke with patient and patient is okay with 11/03/20. Dr. Redmond Pulling would start at 10 AM and will need 2 hours for this patient, so Dr. Bryan Lemma would start at 12:00 PM. It looks like patient is also scheduled at [facility] on 11/17/20. Left a vm for Amy Cannon at Amy Cannon to see if the patient mentioned this to her.

## 2020-09-28 ENCOUNTER — Other Ambulatory Visit: Payer: Self-pay

## 2020-09-28 DIAGNOSIS — K449 Diaphragmatic hernia without obstruction or gangrene: Secondary | ICD-10-CM

## 2020-09-28 DIAGNOSIS — K2100 Gastro-esophageal reflux disease with esophagitis, without bleeding: Secondary | ICD-10-CM

## 2020-09-28 NOTE — Telephone Encounter (Signed)
Spoke with Caryl Pina at [employer]. Patient has been scheduled for cTIF on 11/03/20 with Dr. Redmond Pulling starting at 10 AM and Dr. Loletha Grayer to follow at 12 PM.   Post TIF diet sent to patient via My Chart and mailed on 09/28/20. Patient has been scheduled for her 2 week post TIF follow up on Thursday, 11/17/20 at 11:20 AM.   Lm on vm for patient to return call to discuss.

## 2020-09-28 NOTE — Telephone Encounter (Signed)
Lm on vm for [place] at [employer].

## 2020-09-30 NOTE — Telephone Encounter (Signed)
Lm on home vm for patient to return call.  

## 2020-10-03 NOTE — Telephone Encounter (Signed)
Lm on home vm and mobile for patient to return call.

## 2020-10-13 NOTE — Telephone Encounter (Signed)
Spoke with patient, she states that she has been out of the country. She states that she has received all of the information in the mail and had no questions or concerns at the end of the call.

## 2020-10-25 NOTE — Progress Notes (Addendum)
COVID Vaccine Completed: Date COVID Vaccine completed: Has received booster: COVID vaccine manufacturer: [place]   Date of COVID positive in last 90 days:  PCP - Asencion Noble, MD Cardiologist - Sanda Klein, MD  Chest x-ray -  EKG - 08/05/20 Epic Stress Test - 2014 Epic ECHO - 2014 Epic Cardiac Cath -  Pacemaker/ICD device last checked: Spinal Cord Stimulator:  Sleep Study -  CPAP -   Fasting Blood Sugar -  Checks Blood Sugar _____ times a day  Blood Thinner Instructions: Aspirin Instructions: Last Dose:  Activity level:  Can go up a flight of stairs and perform activities of daily living without stopping and without symptoms of chest pain or shortness of breath.   Able to exercise without symptoms  Unable to go up a flight of stairs without symptoms of      Anesthesia review: Heart murmur mwith significant valvular abnormalities, HTN,  Patient denies shortness of breath, fever, cough and chest pain at PAT appointment   Patient verbalized understanding of instructions that were given to them at the PAT appointment. Patient was also instructed that they will need to review over the PAT instructions again at home before surgery.

## 2020-10-25 NOTE — Patient Instructions (Addendum)
DUE TO COVID-19 ONLY ONE VISITOR IS ALLOWED TO COME WITH YOU AND STAY IN THE WAITING ROOM ONLY DURING PRE OP AND PROCEDURE.   **NO VISITORS ARE ALLOWED IN THE SHORT STAY AREA OR RECOVERY ROOM!!**  IF YOU WILL BE ADMITTED INTO THE HOSPITAL YOU ARE ALLOWED ONLY TWO SUPPORT PEOPLE DURING VISITATION HOURS ONLY (10AM -8PM)   The support person(s) may change daily. The support person(s) must pass our screening, gel in and out, and wear a mask at all times, including in the patient's room. Patients must also wear a mask when staff or their support person are in the room.  No visitors under the age of 30. Any visitor under the age of 78 must be accompanied by an adult.    COVID SWAB TESTING MUST BE COMPLETED ON:   10/31/20 @ 3:00 PM   [address] [place], [place] [address]   You are not required to quarantine, however you are required to wear a well-fitted mask when you are out and around people not in your household.  Hand Hygiene often Do NOT share personal items Notify your provider if you are in close contact with someone who has COVID or you develop fever 100.4 or greater, new onset of sneezing, cough, sore throat, shortness of breath or body aches.   Your procedure is scheduled on: 11/03/20   Report to [facility] Main  Entrance    Report to admitting at 7:45 AM   Call this number if you have problems the morning of surgery [phone]   Do not eat food :After Midnight.   May have liquids until 6:45 AM day of surgery  CLEAR LIQUID DIET  Foods Allowed                                                                     Foods Excluded  Water, Black Coffee and tea, regular and decaf             liquids that you cannot  Plain Jell-O in any flavor  (No red)                                    see through such as: Fruit ices (not with fruit pulp)                                            milk, soups, orange juice              Iced Popsicles (No red)                                                 All solid food                                   Apple juices Sports drinks like Gatorade (No red) Lightly seasoned clear  broth or consume(fat free) Sugar, honey syrup    Drink Pre surgery Ensure drink at 6:45 AM morning of surgery   Oral Hygiene is also important to reduce your risk of infection.                                    Remember - BRUSH YOUR TEETH THE MORNING OF SURGERY WITH YOUR REGULAR TOOTHPASTE   Do NOT smoke after Midnight   Take these medicines the morning of surgery with A SIP OF WATER: Pantoprazole                            You may not have any metal on your body including hair pins, jewelry, and body piercing             Do not wear make-up, lotions, powders, perfumes, or deodorant  Do not wear nail polish including gel and S&S, artificial/acrylic nails, or any other type of covering on natural nails including finger and toenails. If y ou have artificial nails, gel coating, etc. that needs to be removed by a nail salon please have this removed prior to surgery or surgery may need to be canceled/  delayed if the surgeon/ anesthesia feels like they are unable to be safely monitored.   Do not shave  48 hours prior to surgery.    Do not bring valuables to the hospital. [place].   Contacts, dentures or bridgework may not be worn into surgery.   Bring small overnight bag day of surgery.   Special Instructions: Bring a copy of your healthcare power of attorney and living will documents         the day of surgery if you haven't scanned them  in before.              Please read over the following fact sheets you were given: IF YOU HAVE QUESTIONS ABOUT YOUR PRE OP INSTRUCTIONS PLEASE CALL    [phone]    - Preparing for Surgery Before surgery, you can play an important role.  Because skin is not sterile, your skin needs to be as free of germs as possible.  You can reduce the number of  germs on your skin by washing with CHG (chlorahexidine gluconate) soap before surgery.  CHG is an antiseptic cleaner which kills germs and bonds with the skin to continue killing germs even after washing. Please DO NOT use if you have an allergy to CHG or antibacterial soaps.  If your skin becomes reddened/irritated stop using the CHG and inform your nurse when you arrive at Short Stay. Do not shave (including legs and underarms) for at least 48 hours prior to the first CHG shower.  You may shave your face/neck.  Please follow these instructions carefully:  1.  Shower with CHG Soap the night before surgery and the  morning of surgery.  2.  If you choose to wash your hair, wash your hair first as usual with your normal  shampoo.  3.  After you shampoo, rinse your hair and body thoroughly to remove the shampoo.                             4.  Use CHG as you would any other liquid soap.  You can apply chg directly to the skin and wash.  Gently with a scrungie or clean washcloth.  5.  Apply the CHG Soap to your body ONLY FROM THE NECK DOWN.   Do   not use on face/ open                           Wound or open sores. Avoid contact with eyes, ears mouth and   genitals (private parts).                       Wash face,  Genitals (private parts) with your normal soap.             6.  Wash thoroughly, paying special attention to the area where your    surgery  will be performed.  7.  Thoroughly rinse your body with warm water from the neck down.  8.  DO NOT shower/wash with your normal soap after using and rinsing off the CHG Soap.                9.  Pat yourself dry with a clean towel.            10.  Wear clean pajamas.            11.  Place clean sheets on your bed the night of your first shower and do not  sleep with pets. Day of Surgery : Do not apply any lotions/deodorants the morning of surgery.  Please wear clean clothes to the hospital/surgery center.  FAILURE TO FOLLOW THESE INSTRUCTIONS MAY  RESULT IN THE CANCELLATION OF YOUR SURGERY  PATIENT SIGNATURE_________________________________  NURSE SIGNATURE__________________________________  ________________________________________________________________________

## 2020-10-28 ENCOUNTER — Ambulatory Visit: Payer: Self-pay | Admitting: General Surgery

## 2020-10-28 ENCOUNTER — Encounter (HOSPITAL_COMMUNITY)
Admission: RE | Admit: 2020-10-28 | Discharge: 2020-10-28 | Disposition: A | Discharge status: Home / Self Care | Payer: Medicare Other | Source: Ambulatory Visit | Attending: General Surgery | Admitting: General Surgery

## 2020-10-28 ENCOUNTER — Encounter (HOSPITAL_COMMUNITY): Payer: Self-pay

## 2020-10-28 ENCOUNTER — Other Ambulatory Visit: Payer: Self-pay

## 2020-10-28 DIAGNOSIS — Z01812 Encounter for preprocedural laboratory examination: Secondary | ICD-10-CM | POA: Diagnosis present

## 2020-10-28 HISTORY — DX: Pneumonia, unspecified organism: J18.9

## 2020-10-28 LAB — CBC WITH DIFFERENTIAL/PLATELET
Abs Immature Granulocytes: 0.03 10*3/uL (ref 0.00–0.07)
Basophils Absolute: 0 10*3/uL (ref 0.0–0.1)
Basophils Relative: 0 %
Eosinophils Absolute: 0.1 10*3/uL (ref 0.0–0.5)
Eosinophils Relative: 1 %
HCT: 43 % (ref 36.0–46.0)
Hemoglobin: 14 g/dL (ref 12.0–15.0)
Immature Granulocytes: 0 %
Lymphocytes Relative: 26 %
Lymphs Abs: 2.3 10*3/uL (ref 0.7–4.0)
MCH: 30.8 pg (ref 26.0–34.0)
MCHC: 32.6 g/dL (ref 30.0–36.0)
MCV: 94.5 fL (ref 80.0–100.0)
Monocytes Absolute: 0.5 10*3/uL (ref 0.1–1.0)
Monocytes Relative: 6 %
Neutro Abs: 6.1 10*3/uL (ref 1.7–7.7)
Neutrophils Relative %: 67 %
Platelets: 283 10*3/uL (ref 150–400)
RBC: 4.55 MIL/uL (ref 3.87–5.11)
RDW: 13.2 % (ref 11.5–15.5)
WBC: 9.1 10*3/uL (ref 4.0–10.5)
nRBC: 0 % (ref 0.0–0.2)

## 2020-10-28 LAB — COMPREHENSIVE METABOLIC PANEL
ALT: 18 U/L (ref 0–44)
AST: 19 U/L (ref 15–41)
Albumin: 3.7 g/dL (ref 3.5–5.0)
Alkaline Phosphatase: 72 U/L (ref 38–126)
Anion gap: 8 (ref 5–15)
BUN: 16 mg/dL (ref 8–23)
CO2: 25 mmol/L (ref 22–32)
Calcium: 9.5 mg/dL (ref 8.9–10.3)
Chloride: 106 mmol/L (ref 98–111)
Creatinine, Ser: 0.78 mg/dL (ref 0.44–1.00)
GFR, Estimated: >60 mL/min (ref >60)
Glucose, Bld: 112 mg/dL — ABNORMAL HIGH (ref 70–99)
Potassium: 3.7 mmol/L (ref 3.5–5.1)
Sodium: 139 mmol/L (ref 135–145)
Total Bilirubin: 0.4 mg/dL (ref 0.3–1.2)
Total Protein: 6.4 g/dL — ABNORMAL LOW (ref 6.5–8.1)

## 2020-10-28 NOTE — Progress Notes (Signed)
COVID Vaccine Completed: Yes Date COVID Vaccine completed: x2 Has received booster: x2 COVID vaccine manufacturer:    [employer]     Date of COVID positive in last 90 days: no   PCP - Asencion Noble, MD Cardiologist - Sanda Klein, MD   Chest x-ray -N/A EKG - 08/05/20 Epic Stress Test - 2014 Epic ECHO - 2014 Epic Cardiac Cath -N/A Pacemaker/ICD device last checked: N/A Spinal Cord Stimulator:N/A   Sleep Study - N/A CPAP -N/A   Fasting Blood Sugar -N/A Checks Blood Sugar _N/A____ times a day   Blood Thinner Instructions: N/A Aspirin Instructions:N/A Last Dose:N/A   Activity level: Able to exercise without symptoms   Anesthesia review: Heart murmur with significant valvular abnormalities, HTN,   Patient denies shortness of breath, fever, cough and chest pain at PAT appointment     Patient verbalized understanding of instructions that were given to them at the PAT appointment. Patient was also instructed that they will need to review over the PAT instructions again at home before surgery.

## 2020-10-31 ENCOUNTER — Other Ambulatory Visit (HOSPITAL_COMMUNITY)
Admission: RE | Admit: 2020-10-31 | Discharge: 2020-10-31 | Disposition: A | Discharge status: Home / Self Care | Payer: Medicare Other | Source: Ambulatory Visit | Attending: General Surgery | Admitting: General Surgery

## 2020-10-31 DIAGNOSIS — Z20822 Contact with and (suspected) exposure to covid-19: Secondary | ICD-10-CM | POA: Insufficient documentation

## 2020-10-31 DIAGNOSIS — Z01812 Encounter for preprocedural laboratory examination: Secondary | ICD-10-CM | POA: Insufficient documentation

## 2020-10-31 LAB — SARS CORONAVIRUS 2 (TAT 6-24 HRS): SARS Coronavirus 2: NEGATIVE

## 2020-11-02 MED ORDER — BUPIVACAINE LIPOSOME 1.3 % IJ SUSP
20.0000 mL | INTRAMUSCULAR | Status: DC
  Filled 2020-11-02: qty 20

## 2020-11-03 ENCOUNTER — Observation Stay (HOSPITAL_COMMUNITY)
Admission: RE | Admit: 2020-11-03 | Discharge: 2020-11-04 | Disposition: A | Discharge status: Home / Self Care | Payer: Medicare Other | Attending: Gastroenterology | Admitting: Gastroenterology

## 2020-11-03 ENCOUNTER — Ambulatory Visit (HOSPITAL_COMMUNITY): Payer: Medicare Other | Admitting: Physician Assistant

## 2020-11-03 ENCOUNTER — Ambulatory Visit (HOSPITAL_COMMUNITY): Payer: Medicare Other | Admitting: Certified Registered Nurse Anesthetist

## 2020-11-03 ENCOUNTER — Encounter (HOSPITAL_COMMUNITY)
Admission: RE | Disposition: A | Discharge status: Home / Self Care | Payer: Self-pay | Source: Home / Self Care | Attending: Gastroenterology

## 2020-11-03 ENCOUNTER — Encounter (HOSPITAL_COMMUNITY): Payer: Self-pay | Admitting: General Surgery

## 2020-11-03 DIAGNOSIS — Z9889 Other specified postprocedural states: Secondary | ICD-10-CM

## 2020-11-03 DIAGNOSIS — K449 Diaphragmatic hernia without obstruction or gangrene: Principal | ICD-10-CM

## 2020-11-03 DIAGNOSIS — Z87891 Personal history of nicotine dependence: Secondary | ICD-10-CM | POA: Insufficient documentation

## 2020-11-03 DIAGNOSIS — I10 Essential (primary) hypertension: Secondary | ICD-10-CM | POA: Diagnosis not present

## 2020-11-03 DIAGNOSIS — Z85828 Personal history of other malignant neoplasm of skin: Secondary | ICD-10-CM | POA: Insufficient documentation

## 2020-11-03 DIAGNOSIS — K2100 Gastro-esophageal reflux disease with esophagitis, without bleeding: Secondary | ICD-10-CM

## 2020-11-03 HISTORY — PX: TRANSORAL INCISIONLESS FUNDOPLICATION: SHX6840

## 2020-11-03 HISTORY — PX: ESOPHAGOGASTRODUODENOSCOPY: SHX5428

## 2020-11-03 HISTORY — PX: HIATAL HERNIA REPAIR: SHX195

## 2020-11-03 SURGERY — REPAIR, HERNIA, HIATAL, LAPAROSCOPIC
Anesthesia: General | Site: Esophagus

## 2020-11-03 MED ORDER — SODIUM CHLORIDE 0.9 % IV SOLN: INTRAVENOUS | Status: DC

## 2020-11-03 MED ORDER — ENSURE PRE-SURGERY PO LIQD
296.0000 mL | Freq: Once | ORAL | Status: DC
  Filled 2020-11-03: qty 296

## 2020-11-03 MED ORDER — CEFAZOLIN SODIUM-DEXTROSE 2-4 GM/100ML-% IV SOLN
2.0000 g | INTRAVENOUS | Status: AC
  Administered 2020-11-03: 2 g via INTRAVENOUS
  Filled 2020-11-03: qty 100

## 2020-11-03 MED ORDER — DEXAMETHASONE SODIUM PHOSPHATE 10 MG/ML IJ SOLN
INTRAMUSCULAR | Status: AC
  Filled 2020-11-03: qty 1

## 2020-11-03 MED ORDER — MIDAZOLAM HCL 5 MG/5ML IJ SOLN
INTRAMUSCULAR | Status: DC | PRN
  Administered 2020-11-03 (×2): 1 mg via INTRAVENOUS

## 2020-11-03 MED ORDER — KETAMINE HCL 10 MG/ML IJ SOLN
INTRAMUSCULAR | Status: DC | PRN
  Administered 2020-11-03: 20 mg via INTRAVENOUS
  Administered 2020-11-03: 30 mg via INTRAVENOUS

## 2020-11-03 MED ORDER — PANTOPRAZOLE SODIUM 40 MG PO TBEC
40.0000 mg | DELAYED_RELEASE_TABLET | Freq: Two times a day (BID) | ORAL | Status: DC
  Administered 2020-11-03 – 2020-11-04 (×2): 40 mg via ORAL
  Filled 2020-11-03 (×2): qty 1

## 2020-11-03 MED ORDER — FENTANYL CITRATE (PF) 100 MCG/2ML IJ SOLN
INTRAMUSCULAR | Status: DC | PRN
  Administered 2020-11-03: 100 ug via INTRAVENOUS

## 2020-11-03 MED ORDER — FENTANYL CITRATE (PF) 100 MCG/2ML IJ SOLN
INTRAMUSCULAR | Status: AC
  Filled 2020-11-03: qty 2

## 2020-11-03 MED ORDER — SIMETHICONE 80 MG PO CHEW
80.0000 mg | CHEWABLE_TABLET | Freq: Four times a day (QID) | ORAL | Status: DC | PRN
  Administered 2020-11-03 – 2020-11-04 (×3): 80 mg via ORAL
  Filled 2020-11-03 (×3): qty 1

## 2020-11-03 MED ORDER — DEXAMETHASONE SODIUM PHOSPHATE 10 MG/ML IJ SOLN
INTRAMUSCULAR | Status: DC | PRN
  Administered 2020-11-03: 10 mg via INTRAVENOUS

## 2020-11-03 MED ORDER — KETAMINE HCL 10 MG/ML IJ SOLN
INTRAMUSCULAR | Status: AC
  Filled 2020-11-03: qty 1

## 2020-11-03 MED ORDER — BUPIVACAINE-EPINEPHRINE (PF) 0.25% -1:200000 IJ SOLN
INTRAMUSCULAR | Status: AC
  Filled 2020-11-03: qty 30

## 2020-11-03 MED ORDER — ACETAMINOPHEN-CODEINE 120-12 MG/5ML PO SOLN
15.0000 mL | ORAL | Status: DC | PRN
  Administered 2020-11-03 – 2020-11-04 (×2): 15 mL via ORAL
  Filled 2020-11-03 (×5): qty 15

## 2020-11-03 MED ORDER — RALOXIFENE HCL 60 MG PO TABS
60.0000 mg | ORAL_TABLET | Freq: Every evening | ORAL | Status: DC
  Administered 2020-11-03: 60 mg via ORAL
  Filled 2020-11-03: qty 1

## 2020-11-03 MED ORDER — ONDANSETRON HCL 4 MG/2ML IJ SOLN
INTRAMUSCULAR | Status: DC | PRN
  Administered 2020-11-03: 4 mg via INTRAVENOUS

## 2020-11-03 MED ORDER — SCOPOLAMINE 1 MG/3DAYS TD PT72
1.0000 | MEDICATED_PATCH | TRANSDERMAL | Status: DC
  Administered 2020-11-03: 1.5 mg via TRANSDERMAL
  Filled 2020-11-03: qty 1

## 2020-11-03 MED ORDER — DEXAMETHASONE SODIUM PHOSPHATE 4 MG/ML IJ SOLN: 4.0000 mg | INTRAMUSCULAR | Status: DC

## 2020-11-03 MED ORDER — FAMOTIDINE 20 MG PO TABS
40.0000 mg | ORAL_TABLET | Freq: Every day | ORAL | Status: DC
  Administered 2020-11-03: 40 mg via ORAL
  Filled 2020-11-03: qty 2

## 2020-11-03 MED ORDER — HYDROCHLOROTHIAZIDE 12.5 MG PO CAPS
12.5000 mg | ORAL_CAPSULE | Freq: Every day | ORAL | Status: DC
  Administered 2020-11-03: 12.5 mg via ORAL
  Filled 2020-11-03: qty 1

## 2020-11-03 MED ORDER — LIDOCAINE 2% (20 MG/ML) 5 ML SYRINGE
INTRAMUSCULAR | Status: DC | PRN
  Administered 2020-11-03: 60 mg via INTRAVENOUS

## 2020-11-03 MED ORDER — DEXAMETHASONE SODIUM PHOSPHATE 10 MG/ML IJ SOLN
8.0000 mg | Freq: Four times a day (QID) | INTRAMUSCULAR | Status: DC
  Administered 2020-11-03 – 2020-11-04 (×3): 8 mg via INTRAVENOUS
  Filled 2020-11-03 (×3): qty 1

## 2020-11-03 MED ORDER — CHLORHEXIDINE GLUCONATE CLOTH 2 % EX PADS: 6.0000 | MEDICATED_PAD | Freq: Once | CUTANEOUS | Status: DC

## 2020-11-03 MED ORDER — ACETAMINOPHEN 500 MG PO TABS
1000.0000 mg | ORAL_TABLET | ORAL | Status: AC
  Administered 2020-11-03: 1000 mg via ORAL
  Filled 2020-11-03: qty 2

## 2020-11-03 MED ORDER — LACTATED RINGERS IV SOLN: INTRAVENOUS | Status: DC

## 2020-11-03 MED ORDER — IRBESARTAN 150 MG PO TABS
300.0000 mg | ORAL_TABLET | Freq: Every day | ORAL | Status: DC
  Administered 2020-11-03: 300 mg via ORAL
  Filled 2020-11-03: qty 2

## 2020-11-03 MED ORDER — FENTANYL CITRATE (PF) 100 MCG/2ML IJ SOLN
25.0000 ug | INTRAMUSCULAR | Status: DC | PRN
  Administered 2020-11-03 (×2): 25 ug via INTRAVENOUS

## 2020-11-03 MED ORDER — ONDANSETRON HCL 4 MG/2ML IJ SOLN
INTRAMUSCULAR | Status: AC
  Filled 2020-11-03: qty 2

## 2020-11-03 MED ORDER — ORAL CARE MOUTH RINSE: 15.0000 mL | Freq: Once | OROMUCOSAL | Status: AC

## 2020-11-03 MED ORDER — ROCURONIUM BROMIDE 10 MG/ML (PF) SYRINGE
PREFILLED_SYRINGE | INTRAVENOUS | Status: AC
  Filled 2020-11-03: qty 10

## 2020-11-03 MED ORDER — EPHEDRINE SULFATE-NACL 50-0.9 MG/10ML-% IV SOSY
PREFILLED_SYRINGE | INTRAVENOUS | Status: DC | PRN
  Administered 2020-11-03 (×2): 15 mg via INTRAVENOUS

## 2020-11-03 MED ORDER — METOCLOPRAMIDE HCL 5 MG/ML IJ SOLN
10.0000 mg | Freq: Four times a day (QID) | INTRAMUSCULAR | Status: DC
  Administered 2020-11-03 – 2020-11-04 (×3): 10 mg via INTRAVENOUS
  Filled 2020-11-03 (×3): qty 2

## 2020-11-03 MED ORDER — BUPIVACAINE-EPINEPHRINE 0.25% -1:200000 IJ SOLN
INTRAMUSCULAR | Status: DC | PRN
  Administered 2020-11-03: 30 mL

## 2020-11-03 MED ORDER — ONDANSETRON HCL 4 MG/2ML IJ SOLN
4.0000 mg | Freq: Once | INTRAMUSCULAR | Status: DC
Start: 2020-11-03 — End: 2020-11-03
  Filled 2020-11-03: qty 2

## 2020-11-03 MED ORDER — PHENYLEPHRINE 40 MCG/ML (10ML) SYRINGE FOR IV PUSH (FOR BLOOD PRESSURE SUPPORT)
PREFILLED_SYRINGE | INTRAVENOUS | Status: DC | PRN
  Administered 2020-11-03: 40 ug via INTRAVENOUS

## 2020-11-03 MED ORDER — PROPOFOL 10 MG/ML IV BOLUS
INTRAVENOUS | Status: DC | PRN
  Administered 2020-11-03: 100 mg via INTRAVENOUS

## 2020-11-03 MED ORDER — FAMOTIDINE IN NACL 20-0.9 MG/50ML-% IV SOLN
20.0000 mg | Freq: Once | INTRAVENOUS | Status: AC
  Administered 2020-11-03: 20 mg via INTRAVENOUS
  Filled 2020-11-03: qty 50

## 2020-11-03 MED ORDER — LIDOCAINE 2% (20 MG/ML) 5 ML SYRINGE
INTRAMUSCULAR | Status: AC
  Filled 2020-11-03: qty 5

## 2020-11-03 MED ORDER — LACTATED RINGERS IR SOLN
Status: DC | PRN
  Administered 2020-11-03: 1000 mL

## 2020-11-03 MED ORDER — CHLORHEXIDINE GLUCONATE 0.12 % MT SOLN
15.0000 mL | Freq: Once | OROMUCOSAL | Status: AC
  Administered 2020-11-03: 15 mL via OROMUCOSAL

## 2020-11-03 MED ORDER — ONDANSETRON HCL 4 MG/2ML IJ SOLN
4.0000 mg | Freq: Four times a day (QID) | INTRAMUSCULAR | Status: DC
  Administered 2020-11-03 – 2020-11-04 (×3): 4 mg via INTRAVENOUS
  Filled 2020-11-03 (×3): qty 2

## 2020-11-03 MED ORDER — MIDAZOLAM HCL 2 MG/2ML IJ SOLN
INTRAMUSCULAR | Status: AC
  Filled 2020-11-03: qty 2

## 2020-11-03 MED ORDER — IRBESARTAN-HYDROCHLOROTHIAZIDE 300-12.5 MG PO TABS: 1.0000 | ORAL_TABLET | Freq: Every day | ORAL | Status: DC

## 2020-11-03 MED ORDER — HEPARIN SODIUM (PORCINE) 5000 UNIT/ML IJ SOLN
5000.0000 [IU] | Freq: Once | INTRAMUSCULAR | Status: AC
  Administered 2020-11-03: 5000 [IU] via SUBCUTANEOUS
  Filled 2020-11-03: qty 1

## 2020-11-03 MED ORDER — SODIUM CHLORIDE 0.9 % IR SOLN
Status: DC | PRN
  Administered 2020-11-03: 1000 mL

## 2020-11-03 MED ORDER — SUGAMMADEX SODIUM 200 MG/2ML IV SOLN
INTRAVENOUS | Status: DC | PRN
  Administered 2020-11-03: 200 mg via INTRAVENOUS

## 2020-11-03 MED ORDER — PROPOFOL 10 MG/ML IV BOLUS
INTRAVENOUS | Status: AC
  Filled 2020-11-03: qty 20

## 2020-11-03 MED ORDER — LIDOCAINE VISCOUS HCL 2 % MT SOLN
5.0000 mL | Freq: Three times a day (TID) | OROMUCOSAL | Status: DC | PRN
  Filled 2020-11-03: qty 15

## 2020-11-03 MED ORDER — BUPIVACAINE LIPOSOME 1.3 % IJ SUSP
INTRAMUSCULAR | Status: DC | PRN
  Administered 2020-11-03: 20 mL

## 2020-11-03 MED ORDER — ROCURONIUM BROMIDE 10 MG/ML (PF) SYRINGE
PREFILLED_SYRINGE | INTRAVENOUS | Status: DC | PRN
  Administered 2020-11-03: 10 mg via INTRAVENOUS
  Administered 2020-11-03: 50 mg via INTRAVENOUS

## 2020-11-03 MED ORDER — ACETAMINOPHEN-CODEINE 120-12 MG/5ML PO SOLN
15.0000 mL | ORAL | Status: DC | PRN
  Filled 2020-11-03 (×2): qty 15

## 2020-11-03 SURGICAL SUPPLY — 57 items
APL PRP STRL LF DISP 70% ISPRP (MISCELLANEOUS) ×2
APL SKNCLS STERI-STRIP NONHPOA (GAUZE/BANDAGES/DRESSINGS)
APPLIER CLIP ROT 10 11.4 M/L (STAPLE)
APR CLP MED LRG 11.4X10 (STAPLE)
BENZOIN TINCTURE PRP APPL 2/3 (GAUZE/BANDAGES/DRESSINGS) IMPLANT
CABLE HIGH FREQUENCY MONO STRZ (ELECTRODE) IMPLANT
CHLORAPREP W/TINT 26 (MISCELLANEOUS) ×4 IMPLANT
CLIP APPLIE ROT 10 11.4 M/L (STAPLE) IMPLANT
CLOSURE WOUND 1/2 X4 (GAUZE/BANDAGES/DRESSINGS) ×1
COVER WAND RF STERILE (DRAPES) IMPLANT
DECANTER SPIKE VIAL GLASS SM (MISCELLANEOUS) ×2 IMPLANT
DEVICE SUT QUICK LOAD TK 5 (STAPLE) IMPLANT
DEVICE SUT TI-KNOT TK 5X26 (MISCELLANEOUS) IMPLANT
DEVICE SUTURE ENDOST 10MM (ENDOMECHANICALS) ×4 IMPLANT
DEVICE TI KNOT TK5 (MISCELLANEOUS)
DISSECTOR BLUNT TIP ENDO 5MM (MISCELLANEOUS) ×4 IMPLANT
DRAIN PENROSE 0.5X18 (DRAIN) ×4 IMPLANT
DRSG TEGADERM 2-3/8X2-3/4 SM (GAUZE/BANDAGES/DRESSINGS) ×2 IMPLANT
ELECT L-HOOK LAP 45CM DISP (ELECTROSURGICAL)
ELECT REM PT RETURN 15FT ADLT (MISCELLANEOUS) ×4 IMPLANT
ELECTRODE L-HOOK LAP 45CM DISP (ELECTROSURGICAL) ×2 IMPLANT
GAUZE SPONGE 2X2 8PLY STRL LF (GAUZE/BANDAGES/DRESSINGS) IMPLANT
GLOVE SURG POLY ORTHO LF SZ7.5 (GLOVE) ×4 IMPLANT
GLOVE SURG UNDER POLY LF SZ7 (GLOVE) IMPLANT
GOWN STRL REUS W/TWL LRG LVL3 (GOWN DISPOSABLE) ×4 IMPLANT
GOWN STRL REUS W/TWL XL LVL3 (GOWN DISPOSABLE) ×12 IMPLANT
GRASPER SUT TROCAR 14GX15 (MISCELLANEOUS) ×4 IMPLANT
KIT BASIN OR (CUSTOM PROCEDURE TRAY) ×4 IMPLANT
KIT ESOPHYX Z+ (Miscellaneous) ×48 IMPLANT
KIT TURNOVER KIT A (KITS) ×4 IMPLANT
NS IRRIG 1000ML POUR BTL (IV SOLUTION) ×4 IMPLANT
PACK UNIVERSAL I (CUSTOM PROCEDURE TRAY) ×2 IMPLANT
PENCIL SMOKE EVACUATOR (MISCELLANEOUS) IMPLANT
QUICK LOAD TK 5 (STAPLE)
SCISSORS LAP 5X45 EPIX DISP (ENDOMECHANICALS) ×4 IMPLANT
SET BERKELEY SUCTION TUBING (SUCTIONS) ×2 IMPLANT
SET IRRIG TUBING LAPAROSCOPIC (IRRIGATION / IRRIGATOR) ×4 IMPLANT
SET TUBE SMOKE EVAC HIGH FLOW (TUBING) ×4 IMPLANT
SHEARS HARMONIC ACE PLUS 45CM (MISCELLANEOUS) ×4 IMPLANT
SLEEVE XCEL OPT CAN 5 100 (ENDOMECHANICALS) ×12 IMPLANT
SPONGE GAUZE 2X2 STER 10/PKG (GAUZE/BANDAGES/DRESSINGS) ×2
STRIP CLOSURE SKIN 1/2X4 (GAUZE/BANDAGES/DRESSINGS) ×1 IMPLANT
SUT ETHIBOND 2 0 SH (SUTURE) ×12
SUT ETHIBOND 2 0 SH 36X2 (SUTURE) ×6 IMPLANT
SUT MNCRL AB 4-0 PS2 18 (SUTURE) ×4 IMPLANT
SUT SURGIDAC NAB ES-9 0 48 120 (SUTURE) ×8 IMPLANT
TIP INNERVISION DETACH 40FR (MISCELLANEOUS) IMPLANT
TIP INNERVISION DETACH 50FR (MISCELLANEOUS) IMPLANT
TIP INNERVISION DETACH 56FR (MISCELLANEOUS) ×2 IMPLANT
TIPS INNERVISION DETACH 40FR (MISCELLANEOUS)
TOWEL OR 17X26 10 PK STRL BLUE (TOWEL DISPOSABLE) ×4 IMPLANT
TOWEL OR NON WOVEN STRL DISP B (DISPOSABLE) IMPLANT
TRAY FOLEY MTR SLVR 14FR STAT (SET/KITS/TRAYS/PACK) ×2 IMPLANT
TRAY LAPAROSCOPIC (CUSTOM PROCEDURE TRAY) ×4 IMPLANT
TROCAR BLADELESS OPT 5 100 (ENDOMECHANICALS) ×4 IMPLANT
TROCAR XCEL BLUNT TIP 100MML (ENDOMECHANICALS) IMPLANT
TROCAR XCEL NON-BLD 11X100MML (ENDOMECHANICALS) ×4 IMPLANT

## 2020-11-03 NOTE — H&P (View-Only) (Signed)
Chief Complaint: GERD with erosive esophagitis, hiatal hernia   HPI:     Amy Cannon is a 75 y.o. female presenting for laparoscopic hiatal hernia repair with concomitant TIF.  She was last seen by me in the office on 08/24/2020 for evaluation of antireflux surgery and hiatal hernia repair.  No significant change in medical/surgical history since then.  Has a longstanding history of reflux. Index sxs infrequent HB, regurgitation, but most bothersome is chronic, dry cough. No dysphagia. Takes Protonix 40 mg bid and Pepcid 40 mg qhs.  Avoids eating close to bedtime, sleeps with HOB elevated. CT 01/2020 with large hiatal hernia.   Has subsequently stopped taking Protonix due to fear of side effect profile.   She was previously evaluated by Dr. Maryjane Hurter at [facility] in 2021, with recommendation at that time for hiatal hernia repair/fundoplication.  Was seen again by Dr. Maryjane Hurter 07/19/2020, and again recommended hiatal hernia repair with partial fundoplication.  She then pursued opinion for cTIF with [place].   Also recently seen in the Cardiology Clinic been diagnosed with noncardiac chest pain 2/2 GERD.  Normal EKG.  Normal stress test 2014.  No plan for additional cardiac work-up.   Colonoscopy 12/2019 with submucosal, nonobstructing mass at the appendiceal orifice.  Subsequently underwent appendectomy/cecectomy by Dr. 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     Endoscopic History: -EGD (12/2019, Dr. Bary Castilla): LA Grade B esophagitis, 5 cm HH, normal stomach/duodenum -Colonoscopy (12/2019, Dr. Bary Castilla): Submucosal, nonobstructing mass at [facility] measuring 10 mm x 9 mm (path: Benign polypoid mucosa) - EGD (08/2020, Dr. Bryan Lemma): LA Grade B esophagitis, 5 cm HH, Hill grade 4 valve, normal stomach/duodenum   -CT abdomen/pelvis (01/2021): Appendiceal mucocele, large hiatal hernia, small hepatic cyst or hemangioma -Barium swallow (07/19/2020): No stricture, moderate hiatal hernia,  normal motility.  Significant inducible gastroesophageal reflux to the level of lower cervical spine.  Swallow 13 mm barium tablet without issue.  Unremarkable stomach/duodenum  Past Medical History:  Diagnosis Date   Arthritis    Cancer ([place])    BASAL CELL SKIN CANCER   Essential hypertension 05/03/2014   GERD (gastroesophageal reflux disease)    History of hiatal hernia    LARGE   HTN (hypertension)    stress test 08-04-2007 EF 70%; low risk scan , no significant ischemia   Hyperlipemia    Murmur, cardiac    2d-echo on 09-08-2012 EF  55-60% normal study, mild LVH, no significant valve disease   Pneumonia      Past Surgical History:  Procedure Laterality Date   CATARACT EXTRACTION W/ INTRAOCULAR LENS IMPLANT Bilateral    COLONOSCOPY     COLONOSCOPY WITH PROPOFOL N/A 01/13/2020   Procedure: COLONOSCOPY WITH PROPOFOL;  Surgeon: Robert Bellow, MD;  Location: [place];  Service: Endoscopy;  Laterality: N/A;   complex repair wrist/hand/finger     ESOPHAGOGASTRODUODENOSCOPY     ESOPHAGOGASTRODUODENOSCOPY (EGD) WITH PROPOFOL N/A 01/13/2020   Procedure: ESOPHAGOGASTRODUODENOSCOPY (EGD) WITH PROPOFOL;  Surgeon: Robert Bellow, MD;  Location: [place] [facility];  Service: Endoscopy;  Laterality: N/A;   LAPAROSCOPIC APPENDECTOMY N/A 04/01/2020   Procedure: APPENDECTOMY LAPAROSCOPIC;  Surgeon: Robert Bellow, MD;  Location: [facility];  Service: General;  Laterality: N/A;   MINOR IRRIGATION AND DEBRIDEMENT OF WOUND Left    TONSILLECTOMY     Family History  Problem Relation Age of Onset   CAD Other    Cancer Mother        appendectomy and spreaded to colon  Colon cancer Mother    Esophageal cancer Neg Hx    Rectal cancer Neg Hx    Stomach cancer Neg Hx    Social History   Tobacco Use   Smoking status: Former    Pack years: 0.00   Smokeless tobacco: Never  Vaping Use   Vaping Use: Never used  Substance Use Topics   Alcohol use: Yes    Alcohol/week: 5.0 standard  drinks    Types: 5 Standard drinks or equivalent per week    Comment: SOCIALLY   Drug use: No   Current Facility-Administered Medications  Medication Dose Route Frequency Provider Last Rate Last Admin   0.9 %  sodium chloride infusion   Intravenous Continuous Filomena Pokorney V, DO       bupivacaine liposome (EXPAREL) 1.3 % injection 266 mg  20 mL Infiltration On Call to OR Wofford, Drew A, RPH       ceFAZolin (ANCEF) IVPB 2g/100 mL premix  2 g Intravenous On Call to OR Greer Pickerel, MD       Chlorhexidine Gluconate Cloth 2 % PADS 6 each  6 each Topical Once Greer Pickerel, MD       dexamethasone (DECADRON) injection 4 mg  4 mg Intravenous On Call to OR Greer Pickerel, MD       famotidine (PEPCID) IVPB 20 mg premix  20 mg Intravenous Once Rashaunda Rahl V, DO 100 mL/hr at 11/03/20 0918 20 mg at 11/03/20 2542   feeding supplement (ENSURE PRE-SURGERY) liquid 296 mL  296 mL Oral Once Greer Pickerel, MD       lactated ringers infusion   Intravenous Continuous Duane Boston, MD 10 mL/hr at 11/03/20 0805 New Bag at 11/03/20 0805   ondansetron (ZOFRAN) injection 4 mg  4 mg Intravenous Once Michal Strzelecki V, DO       scopolamine (TRANSDERM-SCOP) 1 MG/3DAYS 1.5 mg  1 patch Transdermal On Call to OR Greer Pickerel, MD   1.5 mg at 11/03/20 0809   sodium chloride irrigation 0.9 %    PRN Greer Pickerel, MD   1,000 mL at 11/03/20 7062   Allergies  Allergen Reactions   Lisinopril Cough     Review of Systems: All systems reviewed and negative except where noted in HPI.     Physical Exam:    Wt Readings from Last 3 Encounters:  10/28/20 67 kg  09/13/20 66.7 kg  08/31/20 66.7 kg    BP (!) 151/91   Pulse 71   Temp 98.3 F (36.8 C) (Oral)   Resp 20   SpO2 99%  Constitutional:  Pleasant, in no acute distress. Psychiatric: Normal mood and affect. Behavior is normal. EENT: Pupils normal.  Conjunctivae are normal. No scleral icterus. Neck supple. No cervical LAD. Cardiovascular: Normal rate,  regular rhythm. No edema Pulmonary/chest: Effort normal and breath sounds normal. No wheezing, rales or rhonchi. Abdominal: Soft, nondistended, nontender. Bowel sounds active throughout. There are no masses palpable. No hepatomegaly. Neurological: Alert and oriented to person place and time. Skin: Skin is warm and dry. No rashes noted.   ASSESSMENT AND PLAN;   1) GERD with erosive esophagitis 2) Large hiatal hernia - Plan for laparoscopic hiatal hernia repair by Dr. Redmond Pulling today followed by concomitant TIF (cTIF) - Plan for overnight admission for observation   [place], DO, FACG  11/03/2020, 9:29 AM   No ref. provider found

## 2020-11-03 NOTE — Progress Notes (Signed)
Chief Complaint: GERD with erosive esophagitis, hiatal hernia   HPI:     Amy Cannon is a 75 y.o. female presenting for laparoscopic hiatal hernia repair with concomitant TIF.  She was last seen by me in the office on 08/24/2020 for evaluation of antireflux surgery and hiatal hernia repair.  No significant change in medical/surgical history since then.  Has a longstanding history of reflux. Index sxs infrequent HB, regurgitation, but most bothersome is chronic, dry cough. No dysphagia. Takes Protonix 40 mg bid and Pepcid 40 mg qhs.  Avoids eating close to bedtime, sleeps with HOB elevated. CT 01/2020 with large hiatal hernia.   Has subsequently stopped taking Protonix due to fear of side effect profile.   She was previously evaluated by Dr. Maryjane Hurter at [facility] Surgery in 2021, with recommendation at that time for hiatal hernia repair/fundoplication.  Was seen again by Dr. Maryjane Hurter 07/19/2020, and again recommended hiatal hernia repair with partial fundoplication.  She then pursued opinion for cTIF with [place].   Also recently seen in the Cardiology Clinic been diagnosed with noncardiac chest pain 2/2 GERD.  Normal EKG.  Normal stress test 2014.  No plan for additional cardiac work-up.   Colonoscopy 12/2019 with submucosal, nonobstructing mass at the appendiceal orifice.  Subsequently underwent appendectomy/cecectomy by Dr. 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     Endoscopic History: -EGD (12/2019, Dr. Bary Castilla): LA Grade B esophagitis, 5 cm HH, normal stomach/duodenum -Colonoscopy (12/2019, Dr. Bary Castilla): Submucosal, nonobstructing mass at [facility] measuring 10 mm x 9 mm (path: Benign polypoid mucosa) - EGD (08/2020, Dr. Bryan Lemma): LA Grade B esophagitis, 5 cm HH, Hill grade 4 valve, normal stomach/duodenum   -CT abdomen/pelvis (01/2021): Appendiceal mucocele, large hiatal hernia, small hepatic cyst or hemangioma -Barium swallow (07/19/2020): No stricture, moderate hiatal hernia,  normal motility.  Significant inducible gastroesophageal reflux to the level of lower cervical spine.  Swallow 13 mm barium tablet without issue.  Unremarkable stomach/duodenum  Past Medical History:  Diagnosis Date   Arthritis    Cancer ([place])    BASAL CELL SKIN CANCER   Essential hypertension 05/03/2014   GERD (gastroesophageal reflux disease)    History of hiatal hernia    LARGE   HTN (hypertension)    stress test 08-04-2007 EF 70%; low risk scan , no significant ischemia   Hyperlipemia    Murmur, cardiac    2d-echo on 09-08-2012 EF  55-60% normal study, mild LVH, no significant valve disease   Pneumonia      Past Surgical History:  Procedure Laterality Date   CATARACT EXTRACTION W/ INTRAOCULAR LENS IMPLANT Bilateral    COLONOSCOPY     COLONOSCOPY WITH PROPOFOL N/A 01/13/2020   Procedure: COLONOSCOPY WITH PROPOFOL;  Surgeon: Robert Bellow, MD;  Location: [place];  Service: Endoscopy;  Laterality: N/A;   complex repair wrist/hand/finger     ESOPHAGOGASTRODUODENOSCOPY     ESOPHAGOGASTRODUODENOSCOPY (EGD) WITH PROPOFOL N/A 01/13/2020   Procedure: ESOPHAGOGASTRODUODENOSCOPY (EGD) WITH PROPOFOL;  Surgeon: Robert Bellow, MD;  Location: [place] [facility];  Service: Endoscopy;  Laterality: N/A;   LAPAROSCOPIC APPENDECTOMY N/A 04/01/2020   Procedure: APPENDECTOMY LAPAROSCOPIC;  Surgeon: Robert Bellow, MD;  Location: [facility];  Service: General;  Laterality: N/A;   MINOR IRRIGATION AND DEBRIDEMENT OF WOUND Left    TONSILLECTOMY     Family History  Problem Relation Age of Onset   CAD Other    Cancer Mother        appendectomy and spreaded to colon  Colon cancer Mother    Esophageal cancer Neg Hx    Rectal cancer Neg Hx    Stomach cancer Neg Hx    Social History   Tobacco Use   Smoking status: Former    Pack years: 0.00   Smokeless tobacco: Never  Vaping Use   Vaping Use: Never used  Substance Use Topics   Alcohol use: Yes    Alcohol/week: 5.0 standard  drinks    Types: 5 Standard drinks or equivalent per week    Comment: SOCIALLY   Drug use: No   Current Facility-Administered Medications  Medication Dose Route Frequency Provider Last Rate Last Admin   0.9 %  sodium chloride infusion   Intravenous Continuous Jenalyn Girdner V, DO       bupivacaine liposome (EXPAREL) 1.3 % injection 266 mg  20 mL Infiltration On Call to OR Wofford, Drew A, RPH       ceFAZolin (ANCEF) IVPB 2g/100 mL premix  2 g Intravenous On Call to OR Greer Pickerel, MD       Chlorhexidine Gluconate Cloth 2 % PADS 6 each  6 each Topical Once Greer Pickerel, MD       dexamethasone (DECADRON) injection 4 mg  4 mg Intravenous On Call to OR Greer Pickerel, MD       famotidine (PEPCID) IVPB 20 mg premix  20 mg Intravenous Once Nelida Mandarino V, DO 100 mL/hr at 11/03/20 0918 20 mg at 11/03/20 0623   feeding supplement (ENSURE PRE-SURGERY) liquid 296 mL  296 mL Oral Once Greer Pickerel, MD       lactated ringers infusion   Intravenous Continuous Duane Boston, MD 10 mL/hr at 11/03/20 0805 New Bag at 11/03/20 0805   ondansetron (ZOFRAN) injection 4 mg  4 mg Intravenous Once Nafisa Olds V, DO       scopolamine (TRANSDERM-SCOP) 1 MG/3DAYS 1.5 mg  1 patch Transdermal On Call to OR Greer Pickerel, MD   1.5 mg at 11/03/20 0809   sodium chloride irrigation 0.9 %    PRN Greer Pickerel, MD   1,000 mL at 11/03/20 7628   Allergies  Allergen Reactions   Lisinopril Cough     Review of Systems: All systems reviewed and negative except where noted in HPI.     Physical Exam:    Wt Readings from Last 3 Encounters:  10/28/20 67 kg  09/13/20 66.7 kg  08/31/20 66.7 kg    BP (!) 151/91   Pulse 71   Temp 98.3 F (36.8 C) (Oral)   Resp 20   SpO2 99%  Constitutional:  Pleasant, in no acute distress. Psychiatric: Normal mood and affect. Behavior is normal. EENT: Pupils normal.  Conjunctivae are normal. No scleral icterus. Neck supple. No cervical LAD. Cardiovascular: Normal rate,  regular rhythm. No edema Pulmonary/chest: Effort normal and breath sounds normal. No wheezing, rales or rhonchi. Abdominal: Soft, nondistended, nontender. Bowel sounds active throughout. There are no masses palpable. No hepatomegaly. Neurological: Alert and oriented to person place and time. Skin: Skin is warm and dry. No rashes noted.   ASSESSMENT AND PLAN;   1) GERD with erosive esophagitis 2) Large hiatal hernia - Plan for laparoscopic hiatal hernia repair by Dr. Redmond Pulling today followed by concomitant TIF (cTIF) - Plan for overnight admission for observation   [place], DO, FACG  11/03/2020, 9:29 AM   No ref. provider found

## 2020-11-03 NOTE — Op Note (Signed)
[facility] Patient Name: Amy Cannon Procedure Date: 11/03/2020 MRN: [id number] Attending MD: Gerrit Heck , MD Date of Birth: Mar 02, 1946 CSN: [id number] Age: 75 Admit Type: Inpatient Procedure:                Upper GI endoscopy Indications:              For therapy of reflux esophagitis, For therapy of                            hiatal hernia                           75 yo female with long-standing history of GERD                            with erosive esophagitis and hiatal hernia presents                            for laparoscopic hiatal hernia repair and Transoral                            Incisionless Fundoplication. Providers:                Gerrit Heck, MD, Clyde Lundborg, RN, Elspeth Cho Tech., Technician, Adair Laundry, CRNA Referring MD:              Medicines:                General Anesthesia Complications:            No immediate complications. Estimated Blood Loss:     Estimated blood loss was minimal. Procedure:                Pre-Anesthesia Assessment:                           - Prior to the procedure, a History and Physical                            was performed, and patient medications and                            allergies were reviewed. The patient's tolerance of                            previous anesthesia was also reviewed. The risks                            and benefits of the procedure and the sedation                            options and risks were discussed with the patient.                            All questions  were answered, and informed consent                            was obtained. Prior Anticoagulants: The patient has                            taken no previous anticoagulant or antiplatelet                            agents. ASA Grade Assessment: II - A patient with                            mild systemic disease. After reviewing the risks                            and benefits,  the patient was deemed in                            satisfactory condition to undergo the procedure.                           After obtaining informed consent, the endoscope was                            passed under direct vision. Throughout the                            procedure, the patient's blood pressure, pulse, and                            oxygen saturations were monitored continuously. The                            GIF-H190 ([id number]) Olympus gastroscope was                            introduced through the mouth, and advanced to the                            second part of duodenum. The upper GI endoscopy was                            accomplished without difficulty. The patient                            tolerated the procedure well. Scope In: Scope Out: Findings:      The upper third of the esophagus, middle third of the esophagus and       lower third of the esophagus were normal. In preparation for passage of       the Esophyz device, the decision was made to perform empiric esophageal       dilation. The scope was withdrawn. Dilation was performed with a Maloney       dilator with no resistance at 68 Fr. The dilation site was  examined       following endoscope reinsertion and showed no bleeding, mucosal tear or       perforation. Estimated blood loss: none.      The Z-line was regular and was found 43 cm from the incisors. Th       epreviously noted large hiatal hernia has since been repaired       surgically. The decision was made to perform transoral fundoplication       with the EsophyX Z+ system. Before the procedure, the gastroesophageal       flap valve was classified as Hill Grade II (fold present, opens with       respiration). The endoscope was withdrawn, placed through the plication       device, reinserted into the patient and advanced past the level of the       GE junction at 43 cm from the incisors and into the stomach. Next, the       endoscope was  advanced beyond the device and retroflexed. The first       plication site was identified at the 1 o'clock position. With the device       in the proper position, the helical retractor was deployed and tissue       was pulled into the mold before it was closed. The device was rotated,       suction was applied using the invaginator, then the device was advanced       slightly and two H-shaped fasteners were placed. The device was reloaded       and the process repeated in order to deploy a total of ten fasteners at       the first site. The device was then rotated to the 11 o'clock position       after which the helical retractor was used to grasp additional tissue       within the mold before rotation and deployment of a total of six       fasteners at the second site. To complete reconstruction of the valve,       additional fasteners were deployed at the following sites: four       fasteners at 5 o'clock and four fasteners at 7 o'clock positions. In       total, 24 fasteners contributed to create a valve measuring 3 cm in       length which involved 300 degrees of the circumference upon retroflexed       view. The EsophyX device and endoscope were then removed. Relook       endoscopy was performed prior to the conclusion of the case to confirm       the above findings. Estimated blood loss was minimal.      The entire examined stomach was normal.      The examined duodenum was normal. Impression:               - Normal upper third of esophagus, middle third of                            esophagus and lower third of esophagus. Dilated                            with 54 Fr Maloney.                           -  Z-line regular, 43 cm from the incisors.                           - Normal stomach.                           - Normal examined duodenum.                           - EsophyX transoral fundoplication was performed.                           - No specimens collected. Moderate  Sedation:      Not Applicable - Patient had care per Anesthesia. Recommendation:           -Admit to surgical ward for overnight observation                            with anticipated discharge tomorrow                           -Zofran 4 mg IV every 6 hours x24 hours, then prn                           -Reglan 10 mg every 6 hours x24 hours, then prn                           -Resume scopolamine patch x3 days (applied preop)                           -Continue Protonix 40 mg p.o. BID x2 weeks, then 40                            mg daily x2 weeks, then 20 mg daily x1 week then prn                           -Continue Pepcid 40 mg qhs for 2 weeks, then reduce                            to 20 mg qhs x2 weeks, then discontinue                           -Decadron 8 mg every 6 hours times max 5 doses                           -Gas-X (simethicone) 4225 mg p.o. prn every 6 hours                            gas pain, abdominal discomfort                           -Tylenol 3 (APAP 120 mg/codeine 12 mg per 5 mL): 15  mL's every 4 hours prn pain                           -Colace 100 mg p.o. twice daily if taking pain                            medications                           -Clear liquid diet okay overnight                           -Okay to ambulate with assist around the ward                           -Please do not hesitate to contact me directly with                            any postoperative questions or concerns Procedure Code(s):        --- Professional ---                           [phone], Laparoscopy, surgical, repair of                            paraesophageal hernia, includes fundoplasty, when                            performed; without implantation of mesh                           43450, 22, Dilation of esophagus, by unguided sound                            or bougie, single or multiple passes Diagnosis Code(s):        --- Professional ---                            K21.00, Gastro-esophageal reflux disease with                            esophagitis, without bleeding                           K44.9, Diaphragmatic hernia without obstruction or                            gangrene CPT copyright 2019 [employer]. All rights reserved. The codes documented in this report are preliminary and upon coder review may  be revised to meet current compliance requirements. Gerrit Heck, MD 11/03/2020 1:01:39 PM Number of Addenda: 0

## 2020-11-03 NOTE — Transfer of Care (Signed)
Immediate Anesthesia Transfer of Care Note  Patient: Amy Cannon  Procedure(s) Performed: Procedure(s): LAPAROSCOPIC REPAIR OF HIATAL HERNIA (N/A) TRANSORAL INCISIONLESS FUNDOPLICATION (N/A) ESOPHAGOGASTRODUODENOSCOPY (EGD) (N/A)  Patient Location: PACU  Anesthesia Type:General  Level of Consciousness: Alert, Awake, Oriented  Airway & Oxygen Therapy: Patient Spontanous Breathing  Post-op Assessment: Report given to RN  Post vital signs: Reviewed and stable  Last Vitals:  Vitals:   11/03/20 0808  BP: (!) 151/91  Pulse: 71  Resp: 20  Temp: 36.8 C  SpO2: 34%    Complications: No apparent anesthesia complications

## 2020-11-03 NOTE — Op Note (Signed)
11/03/2020  11:37 AM  PATIENT:  Amy Cannon  75 y.o. female  PRE-OPERATIVE DIAGNOSIS:  hiatal hernia with gerd  POST-OPERATIVE DIAGNOSIS:  hiatal hernia with gerd  PROCEDURE:  Procedure(s): LAPAROSCOPIC REPAIR OF HIATAL HERNIA - Tearra Ouk TRANSORAL INCISIONLESS FUNDOPLICATION ESOPHAGOGASTRODUODENOSCOPY (EGD) LAPAROSCOPIC BILATERAL TAP BLOCK - Caroll Cunnington  SURGEON:  Surgeon(s): Greer Pickerel, MD [name], DO  ASSISTANTS: Leighton Ruff, MD   ANESTHESIA:   general  DRAINS: none   LOCAL MEDICATIONS USED:  MARCAINE    and OTHER exparel  SPECIMEN:  No Specimen  DISPOSITION OF SPECIMEN:  N/A  COUNTS:  YES  INDICATION FOR PROCEDURE: The patient is a 75 year old female who presents with a hiatal hernia. She is referred by Dr Bryan Lemma for evaluation of moderate hiatal hernia with gerd and esophagitis.  She is a long-standing history of a hiatal hernia.  She will have chest discomfort if she leans forward.  She has to sleep elevated.  She feels like a knot in her chest.  She describes it as a eructation and it causes constant coughing at times.  She reports early satiety.  She has to eat very slowly and she can eat a lot without starting to cough or actually vomit.  She has no pain with liquids or solids.  She is currently taking Protonix in the morning, Pepcid in the afternoon and melatonin at night.  With this regimen she has no heartburn per se.  If she does not take her medications and she will have heartburn.  She will occasionally have an acid sensation in her throat.  She denies any tobacco use.  Prior abdominal surgery as a laparoscopic appendectomy and had a low-grade appendiceal mucinous neoplasm, margins negative.  She is quite active.  She rides horses.  She saw cardiology for her chest discomfort and they felt that her chest discomfort was consistent with GERD.  She saw thoracic surgeon at [facility] to discuss hiatal hernia repair and fundoplication.  She saw gastroenterology after  hearing about the tif procedure  PROCEDURE: Patient was given oral Tylenol and gabapentin and Pepcid prior to procedure.  She was also given 5000 units of subcutaneous heparin.  After obtaining informed consent she was taken to the OR 5 at [facility] and placed supine on the operating room table.  General endotracheal anesthesia was established.  A Foley catheter was placed.  Her arms were tucked at her side with the appropriate padding.  She received IV antibiotic prior to skin incision.  Surgical timeout was performed.  I gained access to her abdomen using the Optiview technique in the left upper quadrant about 1 fingerbreadth below the left subcostal margin at Palmer's point.  5 mm laparoscope was advanced through a 5 Miller trocar through all layers of the abdominal wall into the abdominal cavity carefully.  Pneumoperitoneum was smoothly established up to a patient pressure of 15 mmHg without any change in patient vital signs.  The laparoscope was advanced and the abdominal cavity was surveilled.  There is no evidence of injury to surrounding viscera.  She had really no scar tissue from her prior laparoscopic appendectomy.  The patient was placed in steep reverse Trendelenburg.  A 5 Miller trocar was placed slightly above into the left of the umbilicus.  A 5 Miller trocar in the lateral right abdomen and an 11 mm trocar in the right midabdomen and a final 5 mm trocar in the lateral left abdominal wall.  Bilateral laparoscopic Exparel Marcaine tap block was  performed for postoperative pain relief.  A Nathanson liver retractor was placed through the subxiphoid position to reflect up the left lobe of the liver to expose the diaphragm.  There is excellent exposure of the left and right crus and the hiatal hernia.  Her hiatal hernia was moderate.  It was approximately 4 cm in size.  With the aid of my assistant we incised the gastrohepatic ligament with harmonic scalpel.  Identified the right crus of  the diaphragm and we are able to identify the hernia sac and we started reducing the hernia sac out of the mediastinum and carrying it up along the right crus of the diaphragm anteriorly.  This was done with a combination of blunt dissection along with harmonic scalpel.  We able to cross over to the left crura.  At this time with the aid of the assistant I took down some short gastrics along the fundus with harmonic scalpel in order to get to the left crus posteriorly.  We identified the confluence of the left and right crura.  I was able to pass a grasper retrogastric to the left upper quadrant.  We placed a Penrose drain retrogastric and secured it to itself in order to facilitate retraction of the proximal stomach.  At this point I continued my mediastinal dissection.  I continued with a circumferential mediastinal mobilization of the esophagus this is mainly done with a laparoscopic Kitner with occasional harmonic scalpel.  The anterior and posterior vagus nerves were identified.  The parietal pleura or not violated.  The hernia sac was continued to be mobilized and stripped from the mediastinum.  We had achieved full circumferential mobilization of the esophagus.  The aorta was visualized.  At this point it appeared I had approximately 4 cm of intra-abdominal esophagus.  I reduced intra-abdominal pressure to 10 mmHg.  I then reapproximated the left and right room with interrupted 0 Ethibond Surgidac sutures using the Endo Stitch device.  3 sutures were placed in the diaphragm.  I then had anesthesia passed a 56 French lighted tapered bougie.  It was able to slide through the hiatus.  There is still a small keyhole gap since GI will be using a 60 [place] device.  At this point the bougie was removed.  The 11 mm trocar was removed and the fascial defect was closed with a interrupted 0 Vicryl in a PMI suture passer.  Additional Exparel was infiltrated in this location.  The [facility] liver retractor was removed.   The abdominal cavity was inspected and without any evidence of injury to surrounding viscera.  Pneumoperitoneum was released.  Trochars were removed.  Skin incisions were closed with a 4-0 Monocryl in a subcuticular fashion followed by application of Steri-Strips, 2 x 2's and Tegaderms.  All needle, instrument, and sponge counts were correct x2.  There were no immediate complications.  The patient was left intubated.  The endoscopy team was then present in the OR with Dr. Bryan Lemma for the TIF portion of the procedure.  Please see his procedure note regarding the fundoplication  PLAN OF CARE: Admit for overnight observation  PATIENT DISPOSITION:  PACU - hemodynamically stable.   Delay start of Pharmacological VTE agent (>24hrs) due to surgical blood loss or risk of bleeding:  no  Leighton Ruff. Redmond Pulling, MD, FACS General, Bariatric, & Minimally Invasive Surgery [facility] Surgery, [place]

## 2020-11-03 NOTE — Anesthesia Procedure Notes (Signed)
Procedure Name: Intubation Date/Time: 11/03/2020 9:39 AM Performed by: Gerald Leitz, CRNA Pre-anesthesia Checklist: Patient identified, Patient being monitored, Timeout performed, Emergency Drugs available and Suction available Patient Re-evaluated:Patient Re-evaluated prior to induction Oxygen Delivery Method: Circle system utilized Preoxygenation: Pre-oxygenation with 100% oxygen Induction Type: IV induction Ventilation: Mask ventilation without difficulty Laryngoscope Size: Mac and 3 Grade View: Grade I Tube type: Oral Tube size: 7.0 mm Number of attempts: 1 Placement Confirmation: ETT inserted through vocal cords under direct vision, positive ETCO2 and breath sounds checked- equal and bilateral Secured at: 21 cm Tube secured with: Tape Dental Injury: Teeth and Oropharynx as per pre-operative assessment

## 2020-11-03 NOTE — Interval H&P Note (Signed)
History and Physical Interval Note:  11/03/2020 9:32 AM  Amy Cannon  has presented today for surgery, with the diagnosis of hiatal hernia with gerd.  The various methods of treatment have been discussed with the patient and family. After consideration of risks, benefits and other options for treatment, the patient has consented to  Procedure(s): [place] (N/A) TRANSORAL INCISIONLESS FUNDOPLICATION (N/A) ESOPHAGOGASTRODUODENOSCOPY (EGD) (N/A) as a surgical intervention.  The patient's history has been reviewed, patient examined, no change in status, stable for surgery.  I have reviewed the patient's chart and labs.  Questions were answered to the patient's satisfaction.     Dominic Pea Shanara Schnieders

## 2020-11-03 NOTE — Anesthesia Postprocedure Evaluation (Signed)
Anesthesia Post Note  Patient: Amy Cannon  Procedure(s) Performed: LAPAROSCOPIC REPAIR OF HIATAL HERNIA (Abdomen) TRANSORAL INCISIONLESS FUNDOPLICATION (Esophagus) ESOPHAGOGASTRODUODENOSCOPY (EGD) (Esophagus)     Patient location during evaluation: PACU Anesthesia Type: General Level of consciousness: awake and alert Pain management: pain level controlled Vital Signs Assessment: post-procedure vital signs reviewed and stable Respiratory status: spontaneous breathing, nonlabored ventilation and respiratory function stable Cardiovascular status: blood pressure returned to baseline and stable Postop Assessment: no apparent nausea or vomiting Anesthetic complications: no   No notable events documented.  Last Vitals:  Vitals:   11/03/20 1300 11/03/20 1315  BP: (!) 147/89 (!) 155/92  Pulse: 71 69  Resp: 11 14  Temp: (!) 36.3 C   SpO2: 100% 96%    Last Pain:  Vitals:   11/03/20 1315  TempSrc:   PainSc: 0-No pain                 Jaiel Saraceno,W. EDMOND

## 2020-11-03 NOTE — H&P (Signed)
CC: here for surgery  Requesting provider: dr Bryan Lemma  HPI: Amy Cannon is an 75 y.o. female who is here for laparoscopic repair of hiatal hernia with concomitant TIF procedure by Dr. Bryan Lemma. She denies any changes since seen in clinic.    The patient is a 75 year old female who presents with a hiatal hernia. She is referred by Dr Bryan Lemma for evaluation of moderate hiatal hernia with gerd and esophagitis.  She is a long-standing history of a hiatal hernia.  She will have chest discomfort if she leans forward.  She has to sleep elevated.  She feels like a knot in her chest.  She describes it as a eructation and it causes constant coughing at times.  She reports early satiety.  She has to eat very slowly and she can eat a lot without starting to cough or actually vomit.  She has no pain with liquids or solids.  She is currently taking Protonix in the morning, Pepcid in the afternoon and melatonin at night.  With this regimen she has no heartburn per se.  If she does not take her medications and she will have heartburn.  She will occasionally have an acid sensation in her throat.  She denies any tobacco use.  Prior abdominal surgery as a laparoscopic appendectomy and had a low-grade appendiceal mucinous neoplasm, margins negative.  She is quite active.  She rides horses.  She saw cardiology for her chest discomfort and they felt that her chest discomfort was consistent with GERD.  She saw thoracic surgeon at [facility] to discuss hiatal hernia repair and fundoplication.  She saw gastroenterology after hearing about the tif procedure  Endoscopic History: -EGD (12/2019, Dr. Bary Castilla): LA Grade B esophagitis, 5 cm HH, normal stomach/duodenum -Colonoscopy (12/2019, Dr. Bary Castilla): Submucosal, nonobstructing mass at [facility] measuring 10 mm x 9 mm (path: Benign polypoid mucosa)  -CT abdomen/pelvis (01/2021): Appendiceal mucocele, large hiatal hernia, small hepatic cyst or hemangioma -Barium swallow (07/19/2020): No  stricture, moderate hiatal hernia, normal motility.  Significant inducible gastroesophageal reflux to the level of lower cervical spine.  Swallow 13 mm barium tablet without issue.  Unremarkable stomach/duodenum   Past Medical History:  Diagnosis Date   Arthritis    Cancer ([place])    BASAL CELL SKIN CANCER   Essential hypertension 05/03/2014   GERD (gastroesophageal reflux disease)    History of hiatal hernia    LARGE   HTN (hypertension)    stress test 08-04-2007 EF 70%; low risk scan , no significant ischemia   Hyperlipemia    Murmur, cardiac    2d-echo on 09-08-2012 EF  55-60% normal study, mild LVH, no significant valve disease   Pneumonia     Past Surgical History:  Procedure Laterality Date   CATARACT EXTRACTION W/ INTRAOCULAR LENS IMPLANT Bilateral    COLONOSCOPY     COLONOSCOPY WITH PROPOFOL N/A 01/13/2020   Procedure: COLONOSCOPY WITH PROPOFOL;  Surgeon: Robert Bellow, MD;  Location: [facility];  Service: Endoscopy;  Laterality: N/A;   complex repair wrist/hand/finger     ESOPHAGOGASTRODUODENOSCOPY     ESOPHAGOGASTRODUODENOSCOPY (EGD) WITH PROPOFOL N/A 01/13/2020   Procedure: ESOPHAGOGASTRODUODENOSCOPY (EGD) WITH PROPOFOL;  Surgeon: Robert Bellow, MD;  Location: [facility];  Service: Endoscopy;  Laterality: N/A;   LAPAROSCOPIC APPENDECTOMY N/A 04/01/2020   Procedure: APPENDECTOMY LAPAROSCOPIC;  Surgeon: Robert Bellow, MD;  Location: [facility];  Service: General;  Laterality: N/A;   MINOR IRRIGATION AND DEBRIDEMENT OF WOUND Left    TONSILLECTOMY  Family History  Problem Relation Age of Onset   CAD Other    Cancer Mother        appendectomy and spreaded to colon   Colon cancer Mother    Esophageal cancer Neg Hx    Rectal cancer Neg Hx    Stomach cancer Neg Hx     Social:  reports that she has quit smoking. She has never used smokeless tobacco. She reports current alcohol use of about 5.0 standard drinks of alcohol per week. She reports that  she does not use drugs.  Allergies:  Allergies  Allergen Reactions   Lisinopril Cough    Medications: I have reviewed the patient's current medications.   ROS - all of the below systems have been reviewed with the patient and positives are indicated with bold text General: chills, fever or night sweats Eyes: blurry vision or double vision ENT: epistaxis or sore throat Allergy/Immunology: itchy/watery eyes or nasal congestion Hematologic/Lymphatic: bleeding problems, blood clots or swollen lymph nodes Endocrine: temperature intolerance or unexpected weight changes Breast: new or changing breast lumps or nipple discharge Resp: cough, shortness of breath, or wheezing CV: chest pain or dyspnea on exertion GI: as per HPI GU: dysuria, trouble voiding, or hematuria MSK: joint pain or joint stiffness Neuro: TIA or stroke symptoms Derm: pruritus and skin lesion changes Psych: anxiety and depression  PE There were no vitals taken for this visit. Constitutional: NAD; conversant; no deformities Eyes: Moist conjunctiva; no lid lag; anicteric; PERRL Neck: Trachea midline; no thyromegaly Lungs: Normal respiratory effort; no tactile fremitus CV: RRR; no palpable thrills; no pitting edema GI: Abd soft,; no palpable hepatosplenomegaly MSK: Normal gait; no clubbing/cyanosis Psychiatric: Appropriate affect; alert and oriented x3 Lymphatic: No palpable cervical or axillary lymphadenopathy Skin:no rash, lesions, induration  No results found for this or any previous visit (from the past 1 hour(s)).  No results found.  Imaging: reviewed  A/P: Amy Cannon is an 75 y.o. female with  Moderate to large hiatal hernia with GERD  2 OR for laparoscopic hiatal hernia repair immediately followed by TIF procedure Enhanced recovery protocol IV antibiotic Subcutaneous heparin preop All questions asked and answered Extensive prior discussion in the office regarding surgery and risk and benefits  and postoperative expectations  Leighton Ruff. Redmond Pulling, MD, FACS General, Bariatric, & Minimally Invasive Surgery [facility] Surgery, [place]

## 2020-11-03 NOTE — Anesthesia Preprocedure Evaluation (Addendum)
Anesthesia Evaluation  Patient identified by MRN, date of birth, ID band Patient awake    Reviewed: Allergy & Precautions, H&P , NPO status , Patient's Chart, lab work & pertinent test results  Airway Mallampati: II  TM Distance: >3 FB Neck ROM: Full    Dental no notable dental hx. (+) Teeth Intact, Dental Advisory Given   Pulmonary neg pulmonary ROS, former smoker,    Pulmonary exam normal breath sounds clear to auscultation       Cardiovascular hypertension, Pt. on medications  Rhythm:Regular Rate:Normal     Neuro/Psych negative neurological ROS  negative psych ROS   GI/Hepatic Neg liver ROS, hiatal hernia, GERD  Medicated,  Endo/Other  negative endocrine ROS  Renal/GU negative Renal ROS  negative genitourinary   Musculoskeletal  (+) Arthritis , Osteoarthritis,    Abdominal   Peds  Hematology negative hematology ROS (+)   Anesthesia Other Findings   Reproductive/Obstetrics negative OB ROS                            Anesthesia Physical Anesthesia Plan  ASA: 2  Anesthesia Plan: General   Post-op Pain Management:    Induction: Intravenous  PONV Risk Score and Plan: 4 or greater and Ondansetron, Dexamethasone and Treatment may vary due to age or medical condition  Airway Management Planned: Oral ETT  Additional Equipment:   Intra-op Plan:   Post-operative Plan: Extubation in OR  Informed Consent: I have reviewed the patients History and Physical, chart, labs and discussed the procedure including the risks, benefits and alternatives for the proposed anesthesia with the patient or authorized representative who has indicated his/her understanding and acceptance.     Dental advisory given  Plan Discussed with: CRNA  Anesthesia Plan Comments:         Anesthesia Quick Evaluation

## 2020-11-04 ENCOUNTER — Encounter (HOSPITAL_COMMUNITY): Payer: Self-pay | Admitting: General Surgery

## 2020-11-04 DIAGNOSIS — Z9889 Other specified postprocedural states: Secondary | ICD-10-CM

## 2020-11-04 DIAGNOSIS — K449 Diaphragmatic hernia without obstruction or gangrene: Secondary | ICD-10-CM | POA: Diagnosis not present

## 2020-11-04 MED ORDER — PANTOPRAZOLE SODIUM 40 MG PO TBEC: 40.0000 mg | DELAYED_RELEASE_TABLET | Freq: Two times a day (BID) | ORAL | 1 refills | Status: DC

## 2020-11-04 MED ORDER — FAMOTIDINE 40 MG PO TABS
40.0000 mg | ORAL_TABLET | Freq: Every day | ORAL | 1 refills | Status: DC
Start: 2020-11-04 — End: 2020-11-17

## 2020-11-04 MED ORDER — SIMETHICONE 80 MG PO CHEW: 80.0000 mg | CHEWABLE_TABLET | Freq: Four times a day (QID) | ORAL | 0 refills | Status: DC | PRN

## 2020-11-04 MED ORDER — ONDANSETRON HCL 4 MG PO TABS: 4.0000 mg | ORAL_TABLET | Freq: Three times a day (TID) | ORAL | 1 refills | Status: DC | PRN

## 2020-11-04 MED ORDER — METOCLOPRAMIDE HCL 10 MG PO TABS: 10.0000 mg | ORAL_TABLET | Freq: Four times a day (QID) | ORAL | 1 refills | Status: DC | PRN

## 2020-11-04 NOTE — Discharge Instructions (Addendum)
- Protonix 40 mg PO BID for 2 weeks, then 40 mg daily for 2 weeks, then 20 mg daily for 1 week, then prn -Continue Pepcid 40 mg qhs x2 weeks qhs x2 weeks, then discontinue/prn use - Discharge with Zofran 4 mg PO prn Q6 hours for nausea - Discharge with Reglan 10 mg PO prn Q6 hours for nausea - Discharge with Simethicone 80 mg PO prn Q6 hours for bloating/abdominal discomfort - OTC Tylenol as needed for mild postoperative pain   Diet: - 2 weeks of liquid/blender consistency diet followed by 4 weeks slowly progressive diet back to regular per postoperative dietary and - Previously provided with handout for post operative diet plan   Post Op Activity: - Week 1: encourage short distance walking, minimal physical activity, no lifting >5 lbs - Week 2: Slow climbing stairs, no intense exercise, no lifting >5 lbs - Week 3-6: No intense exercise, may lift up to 25 lbs - Week 7: Resume normal activity   - To follow-up with me on 11/17/2020 at 11:20 AM in the [facility] at [place] (Stomach Fundoplication, Hiatal Hernia repair, Achalasia surgery, etc)  ######################################################################  EAT Start with a pureed / full liquid diet (see below) Gradually transition to a high fiber diet with a fiber supplement over the next month after discharge.    WALK Walk an hour a day.  Control your pain to do that.    CONTROL PAIN Control pain so that you can walk, sleep, tolerate sneezing/coughing, go up/down stairs.  HAVE A BOWEL MOVEMENT DAILY Keep your bowels regular to avoid problems.  OK to try a laxative to override constipation.  OK to use an antidairrheal to slow down diarrhea.  Call if not better after 2 tries  CALL IF YOU HAVE PROBLEMS/CONCERNS Call if you are still struggling despite following these instructions. Call if you have concerns not answered by these  instructions  ######################################################################   After your esophageal surgery, expect some sticking with swallowing over the next 1-2 months.    If food sticks when you eat, it is called "dysphagia".  This is due to swelling around your esophagus at the wrap & hiatal diaphragm repair.  It will gradually ease off over the next few months.  To help you through this temporary phase, we start you out on a clear -full liquid diet. Also review diet guidelines from Dr Bryan Lemma  Your first meal in the hospital was thin liquids.  Stay on clears and full liquids for the first few days. Once tolerating that well, you can advance to pureed diet.   We ask patients to stay on a pureed diet for the first 2-3 weeks to avoid anything getting "stuck" near your recent surgery.  Don't be alarmed if your ability to swallow doesn't progress according to this plan.  Everyone is different and some diets can advance more or less quickly.    It is often helpful to crush your medications or split them as they can sometimes stick, especially the first week or so.   Some BASIC RULES to follow are: Maintain an upright position whenever eating or drinking. Take small bites - just a teaspoon size bite at a time. Eat slowly.  It may also help to eat only one food at a time. Consider nibbling through smaller, more frequent meals & avoid the urge to eat BIG meals Do not push through feelings of fullness, nausea, or bloatedness Do not mix  solid foods and liquids in the same mouthful Try not to "wash foods down" with large gulps of liquids. Avoid carbonated (bubbly/fizzy) drinks.   Avoid foods that make you feel gassy or bloated.  Start with bland foods first.  Wait on trying greasy, fried, or spicy meals until you are tolerating more bland solids well. Understand that it will be hard to burp and belch at first.  This gradually improves with time.  Expect to be more  gassy/flatulent/bloated initially.  Walking will help your body manage it better. Consider using medications for bloating that contain simethicone such as  Maalox or Gas-X  Consider crushing her medications, especially smaller pills.  The ability to swallow pills should get easier after a few weeks Eat in a relaxed atmosphere & minimize distractions. Avoid talking while eating.   Do not use straws. Following each meal, sit in an upright position (90 degree angle) for 60 to 90 minutes.  Going for a short walk can help as well If food does stick, don't panic.  Try to relax and let the food pass on its own.  Sipping WARM LIQUID such as strong hot black tea can also help slide it down.   Be gradual in changes & use common sense:  -If you easily tolerating a certain "level" of foods, advance to the next level gradually -If you are having trouble swallowing a particular food, then avoid it.   -If food is sticking when you advance your diet, go back to thinner previous diet (the lower LEVEL) for 1-2 days.  LEVEL 2 = PUREED DIET  Do for the first 2 WEEKS AFTER SURGERY AFTER YOU ARE TOLERATING A FULL LIQUID DIET EASILY  -Foods in this group are pureed or blenderized to a smooth, mashed potato-like consistency.  -If necessary, the pureed foods can keep their shape with the addition of a thickening agent.   -Meat should be pureed to a smooth, pasty consistency.  Hot broth or gravy may be added to the pureed meat, approximately 1 oz. of liquid per 3 oz. serving of meat. -CAUTION:  If any foods do not puree into a smooth consistency, swallowing will be more difficult.  (For example, nuts or seeds sometimes do not blend well.)  Hot Foods Cold Foods  Pureed scrambled eggs and cheese Pureed cottage cheese  Baby cereals Thickened juices and nectars  Thinned cooked cereals (no lumps) Thickened milk or eggnog  Pureed [place] toast or pancakes Ensure  Mashed potatoes Ice cream  Pureed parsley, au gratin,  scalloped potatoes, candied sweet potatoes Fruit or [place] ice, sherbet  Pureed buttered or alfredo noodles Plain yogurt  Pureed vegetables (no corn or peas) Instant breakfast  Pureed soups and creamed soups Smooth pudding, mousse, custard  Pureed scalloped apples Whipped gelatin  Gravies Sugar, syrup, honey, jelly  Sauces, cheese, tomato, barbecue, white, creamed Cream  Any baby food Creamer  Alcohol in moderation (not beer or champagne) Margarine  Coffee or tea Mayonnaise   Ketchup, mustard   Apple sauce   SAMPLE MENU:  PUREED DIET Breakfast Lunch Dinner  Orange juice, 1/2 cup Cream of wheat, 1/2 cup Pineapple juice, 1/2 cup Pureed [place], barley soup, 3/4 cup Pureed Hawaiian chicken, 3 oz  Scrambled eggs, mashed or blended with cheese, 1/2 cup Tea or coffee, 1 cup  Whole milk, 1 cup  Non-dairy creamer, 2 Tbsp. Mashed potatoes, 1/2 cup Pureed cooled broccoli, 1/2 cup Apple sauce, 1/2 cup Coffee or tea Mashed potatoes, 1/2 cup Pureed spinach, 1/2  cup Frozen yogurt, 1/2 cup Tea or coffee      LEVEL 3 = SOFT DIET  After your first 3 weeks, you can advance to a soft diet.   Keep on this diet until everything goes down easily.  Hot Foods Cold Foods  White fish Cottage cheese  Stuffed fish Junior baby fruit  Baby food meals Semi thickened juices  Minced soft cooked, scrambled, poached eggs nectars  Souffle & omelets Ripe mashed bananas  Cooked cereals Canned fruit, pineapple sauce, milk  potatoes Milkshake  Buttered or Alfredo noodles Custard  Cooked cooled vegetable Puddings, including tapioca  Sherbet Yogurt  Vegetable soup or alphabet soup Fruit ice, [place] ice  Gravies Whipped gelatin  Sugar, syrup, honey, jelly Junior baby desserts  Sauces:  Cheese, creamed, barbecue, tomato, white Cream  Coffee or tea Margarine   SAMPLE MENU:  LEVEL 3 Breakfast Lunch Dinner  Orange juice, 1/2 cup Oatmeal, 1/2 cup Scrambled eggs with cheese, 1/2 cup Decaffeinated tea, 1  cup Whole milk, 1 cup Non-dairy creamer, 2 Tbsp Pineapple juice, 1/2 cup Minced beef, 3 oz Gravy, 2 Tbsp Mashed potatoes, 1/2 cup Minced fresh broccoli, 1/2 cup Applesauce, 1/2 cup Coffee, 1 cup [place], barley soup, 3/4 cup Minced Hawaiian chicken, 3 oz Mashed potatoes, 1/2 cup Cooked spinach, 1/2 cup Frozen yogurt, 1/2 cup Non-dairy creamer, 2 Tbsp      LEVEL 4 = CHOPPED DIET  -After all the foods in level 3 (soft diet) are passing through well you should advance up to more chopped foods.  -It is still important to cut these foods into small pieces and eat slowly.  Hot Foods Cold Foods  Poultry Cottage cheese  Chopped Swedish meatballs Yogurt  Meat salads (ground or flaked meat) Milk  Flaked fish (tuna) Milkshakes  Poached or scrambled eggs Soft, cold, dry cereal  Souffles and omelets Fruit juices or nectars  Cooked cereals Chopped canned fruit  Chopped [place] toast or pancakes Canned fruit cocktail  Noodles or pasta (no rice) Pudding, mousse, custard  Cooked vegetables (no frozen peas, corn, or mixed vegetables) Green salad  Canned small sweet peas Ice cream  Creamed soup or vegetable soup Fruit ice, [place] ice  Pureed vegetable soup or alphabet soup Non-dairy creamer  Ground scalloped apples Margarine  Gravies Mayonnaise  Sauces:  Cheese, creamed, barbecue, tomato, white Ketchup  Coffee or tea Mustard   SAMPLE MENU:  LEVEL 4 Breakfast Lunch Dinner  Orange juice, 1/2 cup Oatmeal, 1/2 cup Scrambled eggs with cheese, 1/2 cup Decaffeinated tea, 1 cup Whole milk, 1 cup Non-dairy creamer, 2 Tbsp Ketchup, 1 Tbsp Margarine, 1 tsp Salt, 1/4 tsp Sugar, 2 tsp Pineapple juice, 1/2 cup Ground beef, 3 oz Gravy, 2 Tbsp Mashed potatoes, 1/2 cup Cooked spinach, 1/2 cup Applesauce, 1/2 cup Decaffeinated coffee Whole milk Non-dairy creamer, 2 Tbsp Margarine, 1 tsp Salt, 1/4 tsp Pureed [place], barley soup, 3/4 cup Barbecue chicken, 3 oz Mashed potatoes, 1/2  cup Ground fresh broccoli, 1/2 cup Frozen yogurt, 1/2 cup Decaffeinated tea, 1 cup Non-dairy creamer, 2 Tbsp Margarine, 1 tsp Salt, 1/4 tsp Sugar, 1 tsp    LEVEL 5:  REGULAR FOODS  -Foods in this group are soft, moist, regularly textured foods.   -This level includes meat and breads, which tend to be the hardest things to swallow.   -Eat very slowly, chew well and continue to avoid carbonated drinks. -most people are at this level in 6 weeks  Hot Foods Cold Foods  Baked fish or skinned  Soft cheeses - cottage cheese  Souffles and omelets Cream cheese  Eggs Yogurt  Stuffed shells Milk  Spaghetti with meat sauce Milkshakes  Cooked cereal Cold dry cereals (no nuts, dried fruit, coconut)  [place] toast or pancakes Crackers  Buttered toast Fruit juices or nectars  Noodles or pasta (no rice) Canned fruit  Potatoes (all types) Ripe bananas  Soft, cooked vegetables (no corn, lima, or baked beans) Peeled, ripe, fresh fruit  Creamed soups or vegetable soup Cakes (no nuts, dried fruit, coconut)  Canned chicken noodle soup Plain doughnuts  Gravies Ice cream  Bacon dressing Pudding, mousse, custard  Sauces:  Cheese, creamed, barbecue, tomato, white Fruit ice, [place] ice, sherbet  Decaffeinated tea or coffee Whipped gelatin  Pork chops Regular gelatin   Canned fruited gelatin molds   Sugar, syrup, honey, jam, jelly   Cream   Non-dairy   Margarine   Oil   Mayonnaise   Ketchup   Mustard   TROUBLESHOOTING IRREGULAR BOWELS  1) Avoid extremes of bowel movements (no bad constipation/diarrhea)  2) Miralax 17gm mixed in 8oz. water or juice-daily. May use BID as needed.  3) Gas-x,Phazyme, etc. as needed for gas & bloating.  4) Soft,bland diet. No spicy,greasy,fried foods.  5) Prilosec over-the-counter as needed  6) May hold gluten/wheat products from diet to see if symptoms improve.  7) May try probiotics (Align, Activa, etc) to help calm the bowels down  7) If symptoms become worse  call back immediately.    If you have any questions REGARDING ABDOMINAL INCISIONS please call our office at [place]: [phone].

## 2020-11-04 NOTE — Progress Notes (Signed)
1 Day Post-Op   Subjective/Chief Complaint: Tolerating water Min pain No nausea walked   Objective: Vital signs in last 24 hours: Temp:  [96.3 F (35.7 C)-98.8 F (37.1 C)] 98.8 F (37.1 C) (06/17 0602) Pulse Rate:  [67-81] 81 (06/17 0602) Resp:  [11-22] 16 (06/17 0602) BP: (132-160)/(75-94) 139/82 (06/17 0602) SpO2:  [94 %-100 %] 96 % (06/17 0602) Last BM Date: 11/03/20  Intake/Output from previous day: 06/16 0701 - 06/17 0700 In: 1813.5 [I.V.:1713.5; IV Piggyback:100] Out: 2660 [Urine:2650; Blood:10] Intake/Output this shift: No intake/output data recorded.  Alert, nad Soft, mild TTP, incisions ok  Lab Results:  No results for input(s): WBC, HGB, HCT, PLT in the last 72 hours. BMET No results for input(s): NA, K, CL, CO2, GLUCOSE, BUN, CREATININE, CALCIUM in the last 72 hours. PT/INR No results for input(s): LABPROT, INR in the last 72 hours. ABG No results for input(s): PHART, HCO3 in the last 72 hours.  Invalid input(s): PCO2, PO2  Studies/Results: No results found.  Anti-infectives: Anti-infectives (From admission, onward)    Start     Dose/Rate Route Frequency Ordered Stop   11/03/20 0800  ceFAZolin (ANCEF) IVPB 2g/100 mL premix        2 g 200 mL/hr over 30 Minutes Intravenous On call to O.R. 11/03/20 0747 11/03/20 0958       Assessment/Plan: s/p Procedure(s): LAPAROSCOPIC REPAIR OF HIATAL HERNIA (N/A) TRANSORAL INCISIONLESS FUNDOPLICATION (N/A) ESOPHAGOGASTRODUODENOSCOPY (EGD) (N/A)  No fever, no tachycardia Tolerating water Discussed dc instructions and diet tips/advancement and wound care D/c per GI  LOS: 0 days    Greer Pickerel 11/04/2020

## 2020-11-04 NOTE — Discharge Summary (Signed)
[facility] DISCHARGE SUMMARY  Date of admission: 11/03/2020 Date of discharge: 11/04/2020 Attending: Dr. Bryan Lemma Primary Care provider: Asencion Noble, MD Discharge diagnosis: GERD, hiatal hernia Consultations: None Procedures performed: EGD with Transoral Incisionless Fundoplication (TIF) History: See admission H&P Exam: See below  Hospital course: 1) GERD 2) Hiatal hernia Amy Cannon is a 75 y.o. female s/p laparoscopic hiatal hernia repair along with EGD with Transoral Incisionless Fundoplication (cTIF) completed on 9/37/1696 without complications. Did well overnight, without any acute events.  Tolerating liquid diet and oral medications without issue.  Discharge vital signs: See below Discharge labs: None Disposition: Home in stable condition Follow-up appointments: See below  Day of Discharge:  Did well overnight, without any acute events.  Tolerating liquid diet and oral medications without issue.  Objective: Vital signs in last 24 hours: Temp:  [96.3 F (35.7 C)-98.8 F (37.1 C)] 98.8 F (37.1 C) (06/17 0602) Pulse Rate:  [67-81] 81 (06/17 0602) Resp:  [11-22] 16 (06/17 0602) BP: (132-160)/(75-94) 139/82 (06/17 0602) SpO2:  [94 %-100 %] 96 % (06/17 0602) Last BM Date: 11/03/20 General: NAD Lungs:  CTA b/l, no w/r/r Heart:  RRR, no m/r/g Abdomen:  Mild expected post operative TTP in epigastrium and near incision sites without rebound or guarding, no peritoneal signs. Otherwise, soft, ND, +BS Ext:  No c/c/e    Intake/Output from previous day: 06/16 0701 - 06/17 0700 In: 1813.5 [I.V.:1713.5; IV Piggyback:100] Out: 2660 [Urine:2650; Blood:10] Intake/Output this shift: No intake/output data recorded.   Lab Results: No results for input(s): WBC, HGB, PLT, MCV in the last 72 hours. BMET No results for input(s): NA, K, CL, CO2, GLUCOSE, BUN, CREATININE, CALCIUM in the last 72 hours. LFT No results for input(s): PROT, ALBUMIN, AST, ALT, ALKPHOS,  BILITOT, BILIDIR, IBILI in the last 72 hours. PT/INR No results for input(s): INR in the last 72 hours.    Imaging/Other results: No results found.    Assessment and Plan:  Amy Cannon is a 75 y.o. female s/p EGD with Transoral Incisionless Fundoplication (TIF) completed yesterday with no events on overnight observation. Will plan on d/c to home today with the following plan:   - Protonix 40 mg PO BID for 2 weeks, then 40 mg daily for 2 weeks, then 20 mg daily for 1 week, then prn -Continue Pepcid 40 mg qhs x2 weeks qhs x2 weeks, then discontinue/prn use - Discharge with Zofran 4 mg PO prn Q6 hours for nausea  - Discharge with Reglan 10 mg PO prn Q6 hours for nausea  - Discharge with Simethicone 80 mg PO prn Q6 hours for bloating/abdominal discomfort - OTC Tylenol as needed for mild postoperative pain  Diet:  - 2 weeks of liquid/blender consistency diet followed by 4 weeks slowly progressive diet back to regular per postoperative dietary and - Previously provided with handout for post operative diet plan   Post Op Activity:  - Week 1: encourage short distance walking, minimal physical activity, no lifting >5 lbs  - Week 2: Slow climbing stairs, no intense exercise, no lifting >5 lbs  - Week 3-6: No intense exercise, may lift up to 25 lbs  - Week 7: Resume normal activity   - To follow-up with me on 11/17/2020 at 11:20 AM in the [facility] at [place], DO  11/04/2020, 7:43 AM [facility] Pager [phone]

## 2020-11-04 NOTE — Progress Notes (Signed)
D/C instructions given to patient. Patient had no questions. NT or writer will wheel patient out once her ride is here

## 2020-11-17 ENCOUNTER — Ambulatory Visit (INDEPENDENT_AMBULATORY_CARE_PROVIDER_SITE_OTHER): Payer: Medicare Other | Admitting: Gastroenterology

## 2020-11-17 ENCOUNTER — Other Ambulatory Visit: Payer: Self-pay

## 2020-11-17 ENCOUNTER — Encounter: Payer: Self-pay | Admitting: Gastroenterology

## 2020-11-17 VITALS — BP 142/88 | HR 76 | Ht 64.5 in | Wt 141.2 lb

## 2020-11-17 DIAGNOSIS — K219 Gastro-esophageal reflux disease without esophagitis: Secondary | ICD-10-CM

## 2020-11-17 NOTE — Patient Instructions (Signed)
If you are age 73 or older, your body mass index should be between 23-30. Your Body mass index is 23.87 kg/m. If this is out of the aforementioned range listed, please consider follow up with your Primary Care Provider.  If you are age 43 or younger, your body mass index should be between 19-25. Your Body mass index is 23.87 kg/m. If this is out of the aformentioned range listed, please consider follow up with your Primary Care Provider.   Due to recent changes in healthcare laws, you may see the results of your imaging and laboratory studies on MyChart before your provider has had a chance to review them.  We understand that in some cases there may be results that are confusing or concerning to you. Not all laboratory results come back in the same time frame and the provider may be waiting for multiple results in order to interpret others.  Please give [place] 48 hours in order for your provider to thoroughly review all the results before contacting the office for clarification of your results.   Thank you for choosing me and [facility].  Gerrit Heck, D.O.  Follow up in 6 months

## 2020-11-17 NOTE — Progress Notes (Signed)
Chief Complaint:    Postoperative follow-up  GI History: 75 year old female with a history of HTN, BCC, HLD, follows in the GI clinic for the following:  1) GERD: Longstanding history of reflux. Index sxs infrequent HB, regurgitation, but most bothersome is chronic, dry cough. No dysphagia.  Previously responsive to Protonix 40 mg bid and Pepcid 40 mg qhs, along with avoidance of eating close to bedtime, sleeping with HOB elevated. -EGD (12/2019, Dr. Bary Castilla): LA Grade B esophagitis, 5 cm HH, normal stomach/duodenum -Barium swallow (07/19/2020): No stricture, moderate hiatal hernia, normal motility.  Significant inducible gastroesophageal reflux to the level of lower cervical spine.  Swallow 13 mm barium tablet without issue.  Unremarkable stomach/duodenum - EGD (08/2020, Dr. Bryan Lemma): LA Grade B esophagitis, 5 cm HH, Hill grade 4 valve, normal stomach/duodenum - Laparoscopic hiatal hernia repair with concomitant TIF (11/03/2020) with 24 Serofuse fasteners placed  Additionally, she has a history of low-grade mucocele. - 12/2019: Colonoscopy: submucosal, nonobstructing mass at the appendiceal orifice.   - 01/2020: CT abdomen/pelvis: Appendiceal mucocele, large hiatal hernia, small hepatic cyst or hemangioma -03/2020: Appendectomy/cecectomy by Dr. Bary Castilla for 4.7 cm low-grade mucocele    HPI:     Patient is a 75 y.o. female presenting to the Gastroenterology Clinic for postoperative follow-up.  She underwent laparoscopic hiatal hernia repair with concomitant TIF on 11/03/2020.  Was discharged with Protonix 40 mg bid x2, Pepcid 40 mg qhs, and antiemetics.  Today, she states she feels great.  No complaints at all today.  No postoperative pain.  Has already started weaning acid suppression; stopped Pepcid and Protonix down to 20 mg every morning.  No breakthrough reflux symptoms.  Review of systems:     No chest pain, no SOB, no fevers, no urinary sx   Past Medical History:  Diagnosis Date    Arthritis    Cancer ([place])    BASAL CELL SKIN CANCER   Essential hypertension 05/03/2014   GERD (gastroesophageal reflux disease)    History of hiatal hernia    LARGE   HTN (hypertension)    stress test 08-04-2007 EF 70%; low risk scan , no significant ischemia   Hyperlipemia    Murmur, cardiac    2d-echo on 09-08-2012 EF  55-60% normal study, mild LVH, no significant valve disease   Pneumonia     Patient's surgical history, family medical history, social history, medications and allergies were all reviewed in Epic    Current Outpatient Medications  Medication Sig Dispense Refill   Alpha-Lipoic Acid 600 MG CAPS Take 600 mg by mouth every evening.     aspirin 81 MG tablet Take 81 mg by mouth every evening.      B Complex Vitamins (VITAMIN-B COMPLEX PO) Take 1 tablet by mouth daily.      Beta Carotene (VITAMIN A) 25000 UNIT capsule Take 25,000 Units by mouth every Monday, Wednesday, and Friday.     Carboxymethylcellulose Sodium (REFRESH TEARS OP) Place 1 drop into both eyes daily as needed (dry eyes).     Cholecalciferol (VITAMIN D3) 10 MCG (400 UNIT) CAPS Take 400 Units by mouth daily.     Coenzyme Q10 (COQ-10 PO) Take 300 mg by mouth daily.     Cyanocobalamin (B-12 PO) Take by mouth.     irbesartan-hydrochlorothiazide (AVALIDE) 300-12.5 MG tablet Take 1 tablet by mouth daily. Please schedule appointment for refills. (Patient taking differently: Take 1 tablet by mouth at bedtime.) 30 tablet 0   Magnesium 400 MG CAPS Take 400 mg by  mouth 2 (two) times daily.     Multiple Vitamin (MULTIVITAMIN WITH MINERALS) TABS tablet Take 1 tablet by mouth daily.     Olive Leaf Extract 250 MG CAPS Take 250 mg by mouth daily.     ondansetron (ZOFRAN) 4 MG tablet Take 1 tablet (4 mg total) by mouth every 8 (eight) hours as needed for nausea or vomiting. 30 tablet 1   OVER THE COUNTER MEDICATION Take 1 tablet by mouth daily. Bone Collagenizer otc supplement Vitamin C supplement     pantoprazole (PROTONIX)  40 MG tablet Take 1 tablet (40 mg total) by mouth 2 (two) times daily. Take BID x2 weeks then daily x2 weeks, then reduce to 20 mg daily x2 weeks, then discontinue 90 tablet 1   raloxifene (EVISTA) 60 MG tablet Take 60 mg by mouth every evening.      RESVERATROL PO Take 1 tablet by mouth daily. Vitamin supplement     simethicone (MYLICON) 80 MG chewable tablet Chew 1 tablet (80 mg total) by mouth every 6 (six) hours as needed for flatulence (gas, bloating, abdominal discomfort). 30 tablet 0   vitamin C (ASCORBIC ACID) 500 MG tablet Take 500 mg by mouth daily.     No current facility-administered medications for this visit.    Physical Exam:     There were no vitals taken for this visit.  GENERAL:  Pleasant female in NAD PSYCH: : Cooperative, normal affect NEURO: Alert and oriented x 3, no focal neurologic deficits   IMPRESSION and PLAN:    1) GERD 2) Hiatal hernia 3) s/p Transoral Incisionless Fundoplication  Excellent response to recent concomitant hiatal hernia repair and TIF with resolution of reflux symptoms.  Tolerating postop diet without issue.  Has aggressively weaned down on her acid suppression without any breakthrough reflux symptoms.  - Continue weaning PPI - Continue slowly advancing diet per post op protocol  RTC prn          [name] ,DO, FACG 11/17/2020, 11:24 AM

## 2020-12-26 ENCOUNTER — Telehealth: Payer: Self-pay

## 2020-12-26 NOTE — Telephone Encounter (Signed)
I contacted the patient , LVM to confirm Pantoprazole 40 MG refill that We receive from [facility]. Requested to call me back how if she needs refill.

## 2021-03-14 ENCOUNTER — Inpatient Hospital Stay (HOSPITAL_COMMUNITY)
Admission: EM | Admit: 2021-03-14 | Discharge: 2021-03-18 | DRG: 085 | Disposition: A | Discharge status: Home / Self Care | Payer: Medicare Other | Attending: Neurosurgery | Admitting: Neurosurgery

## 2021-03-14 ENCOUNTER — Encounter (HOSPITAL_COMMUNITY): Payer: Self-pay | Admitting: Emergency Medicine

## 2021-03-14 ENCOUNTER — Emergency Department (HOSPITAL_COMMUNITY): Payer: Medicare Other

## 2021-03-14 DIAGNOSIS — Z85828 Personal history of other malignant neoplasm of skin: Secondary | ICD-10-CM | POA: Diagnosis not present

## 2021-03-14 DIAGNOSIS — S06A0XA Traumatic brain compression without herniation, initial encounter: Secondary | ICD-10-CM | POA: Diagnosis present

## 2021-03-14 DIAGNOSIS — Z800 Family history of malignant neoplasm of digestive organs: Secondary | ICD-10-CM | POA: Diagnosis not present

## 2021-03-14 DIAGNOSIS — Z87891 Personal history of nicotine dependence: Secondary | ICD-10-CM | POA: Diagnosis not present

## 2021-03-14 DIAGNOSIS — R29810 Facial weakness: Secondary | ICD-10-CM | POA: Diagnosis present

## 2021-03-14 DIAGNOSIS — I10 Essential (primary) hypertension: Secondary | ICD-10-CM | POA: Diagnosis present

## 2021-03-14 DIAGNOSIS — S065X0A Traumatic subdural hemorrhage without loss of consciousness, initial encounter: Secondary | ICD-10-CM | POA: Diagnosis present

## 2021-03-14 DIAGNOSIS — Z23 Encounter for immunization: Secondary | ICD-10-CM | POA: Diagnosis present

## 2021-03-14 DIAGNOSIS — Z20822 Contact with and (suspected) exposure to covid-19: Secondary | ICD-10-CM | POA: Diagnosis present

## 2021-03-14 DIAGNOSIS — S065XAA Traumatic subdural hemorrhage with loss of consciousness status unknown, initial encounter: Secondary | ICD-10-CM | POA: Diagnosis present

## 2021-03-14 DIAGNOSIS — Z7982 Long term (current) use of aspirin: Secondary | ICD-10-CM | POA: Diagnosis not present

## 2021-03-14 DIAGNOSIS — Y9352 Activity, horseback riding: Secondary | ICD-10-CM | POA: Diagnosis not present

## 2021-03-14 DIAGNOSIS — Z8249 Family history of ischemic heart disease and other diseases of the circulatory system: Secondary | ICD-10-CM | POA: Diagnosis not present

## 2021-03-14 DIAGNOSIS — W19XXXA Unspecified fall, initial encounter: Secondary | ICD-10-CM

## 2021-03-14 DIAGNOSIS — S0990XA Unspecified injury of head, initial encounter: Secondary | ICD-10-CM

## 2021-03-14 LAB — BASIC METABOLIC PANEL
Anion gap: 8 (ref 5–15)
BUN: 14 mg/dL (ref 8–23)
CO2: 24 mmol/L (ref 22–32)
Calcium: 9.4 mg/dL (ref 8.9–10.3)
Chloride: 103 mmol/L (ref 98–111)
Creatinine, Ser: 0.57 mg/dL (ref 0.44–1.00)
GFR, Estimated: >60 mL/min (ref >60)
Glucose, Bld: 112 mg/dL — ABNORMAL HIGH (ref 70–99)
Potassium: 3.3 mmol/L — ABNORMAL LOW (ref 3.5–5.1)
Sodium: 135 mmol/L (ref 135–145)

## 2021-03-14 LAB — CBC WITH DIFFERENTIAL/PLATELET
Abs Immature Granulocytes: 0.03 10*3/uL (ref 0.00–0.07)
Basophils Absolute: 0 10*3/uL (ref 0.0–0.1)
Basophils Relative: 0 %
Eosinophils Absolute: 0 10*3/uL (ref 0.0–0.5)
Eosinophils Relative: 0 %
HCT: 42.7 % (ref 36.0–46.0)
Hemoglobin: 14.6 g/dL (ref 12.0–15.0)
Immature Granulocytes: 0 %
Lymphocytes Relative: 8 %
Lymphs Abs: 1 10*3/uL (ref 0.7–4.0)
MCH: 31.4 pg (ref 26.0–34.0)
MCHC: 34.2 g/dL (ref 30.0–36.0)
MCV: 91.8 fL (ref 80.0–100.0)
Monocytes Absolute: 0.4 10*3/uL (ref 0.1–1.0)
Monocytes Relative: 3 %
Neutro Abs: 11 10*3/uL — ABNORMAL HIGH (ref 1.7–7.7)
Neutrophils Relative %: 89 %
Platelets: 242 10*3/uL (ref 150–400)
RBC: 4.65 MIL/uL (ref 3.87–5.11)
RDW: 12.6 % (ref 11.5–15.5)
WBC: 12.4 10*3/uL — ABNORMAL HIGH (ref 4.0–10.5)
nRBC: 0 % (ref 0.0–0.2)

## 2021-03-14 LAB — RESP PANEL BY RT-PCR (FLU A&B, COVID) ARPGX2
Influenza A by PCR: NEGATIVE
Influenza B by PCR: NEGATIVE
SARS Coronavirus 2 by RT PCR: NEGATIVE

## 2021-03-14 LAB — PROTIME-INR
INR: 1 (ref 0.8–1.2)
Prothrombin Time: 12.8 seconds (ref 11.4–15.2)

## 2021-03-14 MED ORDER — SODIUM CHLORIDE 0.9 % IV SOLN: INTRAVENOUS | Status: DC

## 2021-03-14 MED ORDER — CHLORHEXIDINE GLUCONATE CLOTH 2 % EX PADS
6.0000 | MEDICATED_PAD | Freq: Every day | CUTANEOUS | Status: DC
  Administered 2021-03-15 – 2021-03-17 (×3): 6 via TOPICAL

## 2021-03-14 MED ORDER — SODIUM CHLORIDE 0.9 % IV BOLUS
500.0000 mL | Freq: Once | INTRAVENOUS | Status: AC
  Administered 2021-03-14: 500 mL via INTRAVENOUS

## 2021-03-14 MED ORDER — ACETAMINOPHEN 325 MG PO TABS
650.0000 mg | ORAL_TABLET | Freq: Four times a day (QID) | ORAL | Status: DC | PRN
  Administered 2021-03-15 – 2021-03-18 (×13): 650 mg via ORAL
  Filled 2021-03-14 (×13): qty 2

## 2021-03-14 MED ORDER — ONDANSETRON HCL 4 MG/2ML IJ SOLN: 4.0000 mg | Freq: Four times a day (QID) | INTRAMUSCULAR | Status: DC | PRN

## 2021-03-14 MED ORDER — HYDROMORPHONE HCL 1 MG/ML IJ SOLN
0.5000 mg | INTRAMUSCULAR | Status: DC | PRN
  Administered 2021-03-14: 1 mg via INTRAVENOUS
  Filled 2021-03-14: qty 1

## 2021-03-14 MED ORDER — ACETAMINOPHEN 650 MG RE SUPP: 650.0000 mg | Freq: Four times a day (QID) | RECTAL | Status: DC | PRN

## 2021-03-14 MED ORDER — DEXAMETHASONE SODIUM PHOSPHATE 10 MG/ML IJ SOLN
6.0000 mg | Freq: Once | INTRAMUSCULAR | Status: AC
  Administered 2021-03-14: 6 mg via INTRAVENOUS
  Filled 2021-03-14: qty 1

## 2021-03-14 MED ORDER — METOCLOPRAMIDE HCL 5 MG/ML IJ SOLN
10.0000 mg | Freq: Once | INTRAMUSCULAR | Status: AC
  Administered 2021-03-14: 10 mg via INTRAVENOUS
  Filled 2021-03-14: qty 2

## 2021-03-14 MED ORDER — ONDANSETRON HCL 4 MG PO TABS: 4.0000 mg | ORAL_TABLET | Freq: Four times a day (QID) | ORAL | Status: DC | PRN

## 2021-03-14 MED ORDER — ONDANSETRON HCL 4 MG/2ML IJ SOLN
4.0000 mg | Freq: Once | INTRAMUSCULAR | Status: AC
  Administered 2021-03-14: 4 mg via INTRAVENOUS
  Filled 2021-03-14: qty 2

## 2021-03-14 MED ORDER — INFLUENZA VAC A&B SA ADJ QUAD 0.5 ML IM PRSY
0.5000 mL | PREFILLED_SYRINGE | INTRAMUSCULAR | Status: AC
  Administered 2021-03-18: 0.5 mL via INTRAMUSCULAR
  Filled 2021-03-14 (×2): qty 0.5

## 2021-03-14 NOTE — ED Provider Notes (Signed)
[facility] EMERGENCY DEPARTMENT Provider Note   CSN: [id number] Arrival date & time: 03/14/21  1628     History Chief Complaint  Patient presents with   Fall    Amy Cannon is a 75 y.o. female present emergency department with a fall off of a horse and head injury.  The patient reports that she was thrown off of a horse around noon today, fell around on the saddle and struck her head on the ground.  There is no loss of consciousness.  She says she felt dazed afterwards.  He takes 81 mg of aspirin but no other types of blood thinning medicine.  She reports a prior history of head injuries including brain bleed after other horse related injuries, this occurred approximately 10 years ago.  She is here with her close friend at the bedside who is a physician Iline Oven) at [phone].  The patient has verbally designated this person as her medical decision-maker in the event that she cannot advocate or speak for herself, and Dr. Carma Lair has accepted this responsibility verbally to me.  HPI     Past Medical History:  Diagnosis Date   Arthritis    Cancer ([place])    BASAL CELL SKIN CANCER   Essential hypertension 05/03/2014   GERD (gastroesophageal reflux disease)    History of hiatal hernia    LARGE   HTN (hypertension)    stress test 08-04-2007 EF 70%; low risk scan , no significant ischemia   Hyperlipemia    Murmur, cardiac    2d-echo on 09-08-2012 EF  55-60% normal study, mild LVH, no significant valve disease   Pneumonia     Patient Active Problem List   Diagnosis Date Noted   History of fundoplication    Hiatal hernia with GERD and esophagitis 11/03/2020   Gastroesophageal reflux disease with esophagitis without hemorrhage    Hiatal hernia    Essential hypertension 05/03/2014    Past Surgical History:  Procedure Laterality Date   CATARACT EXTRACTION W/ INTRAOCULAR LENS IMPLANT Bilateral    COLONOSCOPY     COLONOSCOPY WITH PROPOFOL N/A  01/13/2020   Procedure: COLONOSCOPY WITH PROPOFOL;  Surgeon: Robert Bellow, MD;  Location: [facility];  Service: Endoscopy;  Laterality: N/A;   complex repair wrist/hand/finger     ESOPHAGOGASTRODUODENOSCOPY     ESOPHAGOGASTRODUODENOSCOPY N/A 11/03/2020   Procedure: ESOPHAGOGASTRODUODENOSCOPY (EGD);  Surgeon: [name], DO;  Location: [facility];  Service: Gastroenterology;  Laterality: N/A;   ESOPHAGOGASTRODUODENOSCOPY (EGD) WITH PROPOFOL N/A 01/13/2020   Procedure: ESOPHAGOGASTRODUODENOSCOPY (EGD) WITH PROPOFOL;  Surgeon: Robert Bellow, MD;  Location: [facility];  Service: Endoscopy;  Laterality: N/A;   HIATAL HERNIA REPAIR N/A 11/03/2020   Procedure: LAPAROSCOPIC REPAIR OF HIATAL HERNIA;  Surgeon: Greer Pickerel, MD;  Location: [facility];  Service: General;  Laterality: N/A;   LAPAROSCOPIC APPENDECTOMY N/A 04/01/2020   Procedure: APPENDECTOMY LAPAROSCOPIC;  Surgeon: Robert Bellow, MD;  Location: [facility];  Service: General;  Laterality: N/A;   MINOR IRRIGATION AND DEBRIDEMENT OF WOUND Left    TONSILLECTOMY     TRANSORAL INCISIONLESS FUNDOPLICATION N/A 08/09/252   Procedure: TRANSORAL INCISIONLESS FUNDOPLICATION;  Surgeon: [name], DO;  Location: [facility];  Service: Gastroenterology;  Laterality: N/A;     OB History   No obstetric history on file.     Family History  Problem Relation Age of Onset   CAD Other    Cancer Mother        appendectomy  and spreaded to colon   Colon cancer Mother    Esophageal cancer Neg Hx    Rectal cancer Neg Hx    Stomach cancer Neg Hx     Social History   Tobacco Use   Smoking status: Former   Smokeless tobacco: Never  Vaping Use   Vaping Use: Never used  Substance Use Topics   Alcohol use: Yes    Alcohol/week: 5.0 standard drinks    Types: 5 Standard drinks or equivalent per week    Comment: SOCIALLY   Drug use: No    Home Medications Prior to Admission medications   Medication Sig Start Date End Date  Taking? Authorizing Provider  Alpha-Lipoic Acid 600 MG CAPS Take 600 mg by mouth every evening.    [provider]  aspirin 81 MG tablet Take 81 mg by mouth every evening.     [provider]  B Complex Vitamins (VITAMIN-B COMPLEX PO) Take 1 tablet by mouth daily.     [provider]  Beta Carotene (VITAMIN A) 25000 UNIT capsule Take 25,000 Units by mouth every Monday, Wednesday, and Friday.    [provider]  Carboxymethylcellulose Sodium (REFRESH TEARS OP) Place 1 drop into both eyes daily as needed (dry eyes).    [provider]  Cholecalciferol (VITAMIN D3) 10 MCG (400 UNIT) CAPS Take 400 Units by mouth daily.    [provider]  Coenzyme Q10 (COQ-10 PO) Take 300 mg by mouth daily.    [provider]  Cyanocobalamin (B-12 PO) Take by mouth.    [provider]  irbesartan-hydrochlorothiazide (AVALIDE) 300-12.5 MG tablet Take 1 tablet by mouth daily. Please schedule appointment for refills. Patient taking differently: Take 1 tablet by mouth at bedtime. 10/28/15   Croitoru, Mihai, MD  Magnesium 400 MG CAPS Take 400 mg by mouth 2 (two) times daily.    [provider]  Multiple Vitamin (MULTIVITAMIN WITH MINERALS) TABS tablet Take 1 tablet by mouth daily.    [provider]  Olive Leaf Extract 250 MG CAPS Take 250 mg by mouth daily.    [provider]  ondansetron (ZOFRAN) 4 MG tablet Take 1 tablet (4 mg total) by mouth every 8 (eight) hours as needed for nausea or vomiting. 11/04/20 11/04/21  Cirigliano, Vito V, DO  OVER THE COUNTER MEDICATION Take 1 tablet by mouth daily. Bone Collagenizer otc supplement Vitamin C supplement    [provider]  pantoprazole (PROTONIX) 40 MG tablet Take 1 tablet (40 mg total) by mouth 2 (two) times daily. Take BID x2 weeks then daily x2 weeks, then reduce to 20 mg daily x2 weeks, then discontinue Patient taking differently: Take 20 mg by mouth 2 (two) times  daily. Take 20mg  x2 weeks  then discontinue 11/04/20   Cirigliano, Vito V, DO  raloxifene (EVISTA) 60 MG tablet Take 60 mg by mouth every evening.     [provider]  RESVERATROL PO Take 1 tablet by mouth daily. Vitamin supplement    [provider]  simethicone (MYLICON) 80 MG chewable tablet Chew 1 tablet (80 mg total) by mouth every 6 (six) hours as needed for flatulence (gas, bloating, abdominal discomfort). 11/04/20   Cirigliano, Vito V, DO  vitamin C (ASCORBIC ACID) 500 MG tablet Take 500 mg by mouth daily.    [provider]    Allergies    Lisinopril  Review of Systems   Review of Systems  Constitutional:  Negative for chills and fever.  Eyes:  Negative for pain and visual disturbance.  Respiratory:  Negative for cough and shortness of breath.   Cardiovascular:  Negative for chest pain and palpitations.  Gastrointestinal:  Positive for nausea. Negative for abdominal pain and vomiting.  Genitourinary:  Negative for dysuria and hematuria.  Musculoskeletal:  Negative for arthralgias and neck pain.  Skin:  Negative for color change and rash.  Neurological:  Positive for dizziness, light-headedness and headaches. Negative for syncope, speech difficulty and numbness.  All other systems reviewed and are negative.  Physical Exam Updated Vital Signs BP (!) 168/98   Pulse 74   Temp 97.8 F (36.6 C) (Oral)   Resp 17   SpO2 99%   Physical Exam Constitutional:      General: She is not in acute distress. HENT:     Head: Normocephalic and atraumatic.  Eyes:     Conjunctiva/sclera: Conjunctivae normal.     Pupils: Pupils are equal, round, and reactive to light.  Cardiovascular:     Rate and Rhythm: Normal rate and regular rhythm.  Pulmonary:     Effort: Pulmonary effort is normal. No respiratory distress.  Abdominal:     General: There is no distension.     Tenderness: There is no abdominal tenderness.  Musculoskeletal:     Cervical back: Normal range  of motion and neck supple. No rigidity.  Skin:    General: Skin is warm and dry.  Neurological:     General: No focal deficit present.     Mental Status: She is alert. Mental status is at baseline.  Psychiatric:        Mood and Affect: Mood normal.        Behavior: Behavior normal.    ED Results / Procedures / Treatments   Labs (all labs ordered are listed, but only abnormal results are displayed) Labs Reviewed  BASIC METABOLIC PANEL  CBC WITH DIFFERENTIAL/PLATELET  PROTIME-INR    EKG None  Radiology CT Head Wo Contrast  Result Date: 03/14/2021 CLINICAL DATA:  Head trauma, minor (Age >= 65y) EXAM: CT HEAD WITHOUT CONTRAST TECHNIQUE: Contiguous axial images were obtained from the base of the skull through the vertex without intravenous contrast. COMPARISON:  CT 05/05/2009. FINDINGS: Brain: Acute right cerebral convexity subdural hemorrhage measuring up to 5 mm in thickness. Small volume of hemorrhage extending along the right tentorial leaflet. Resulting mass effect with approximately 5 mm of leftward midline shift at the foramina [place]. Partial effacement of the right lateral ventricle. Multiple small areas of ill-defined area of hypoattenuation in the right frontal lobe (for example inferiorly on image 3, image 9; image 5, 25; and anteriorly on series 3, image 11; series 5, image 10) and in the left subinsular region (series 3, image 11), likely similar to remote priors. No evidence of acute large vascular territory infarct, mass lesion. Basal cisterns are patent. Vascular: No hyperdense vessel identified. Calcific intracranial atherosclerosis. Skull: No acute fracture. Sinuses/Orbits: Partially imaged air-fluid level in the left maxillary sinus. Other: No mastoid effusions. IMPRESSION: 1. Acute 5 mm thick right cerebral convexity subdural hematoma with approximately 5 mm of leftward midline shift. Small volume of subdural hemorrhage along the right tentorial leaflet. 2. Multiple small  areas of ill-defined area of hypoattenuation in the right frontal lobe and left subinsular white matter may represent chronic encephalomalcia given similar findings on prior CT head. An MRI could further characterize if clinically indicated. 3. Partially imaged air-fluid level in the left maxillary sinus. Dedicated maxillofacial CT could further  evaluate for facial fracture if there has been facial trauma. Findings discussed wtih Dr. Ether Griffins telephone at 7:16 p.m. Electronically Signed   By: Margaretha Sheffield M.D.   On: 03/14/2021 19:22    Procedures Procedures   Medications Ordered in ED Medications  dexamethasone (DECADRON) injection 6 mg (has no administration in time range)  metoCLOPramide (REGLAN) injection 10 mg (has no administration in time range)  ondansetron (ZOFRAN) injection 4 mg (has no administration in time range)  sodium chloride 0.9 % bolus 500 mL (has no administration in time range)    ED Course  I have reviewed the triage vital signs and the nursing notes.  Pertinent labs & imaging results that were available during my care of the patient were reviewed by me and considered in my medical decision making (see chart for details).  Patient is here with a traumatic injury thrown off of a horse, struck her head on the ground, found on CT imaging after triage to have a 5 mm subdural hematoma with some midline shift.  She is neurologically intact, does have some concussive symptoms with days speech, but no other neurological deficits.  She reports some nausea.  Otherwise has benign neuro exam.  I have ordered some headache medications and IV Zofran with a small fluid bolus, as well as a dose of dexamethasone, for headache.  I have consulted neurosurgery, who will see the patient.  No other traumatic injuries noted on my exam  Labs reviewed -largely unremarkable per my interpretation Supplemental history provided by friend Dr Aileen Fass at bedside, whom the patient designated her  medical decision maker   Patient admitted for neuro monitoring, likely repeat neuro imaging.  Stable at the time of admission, with GCS 15 and lucid  Clinical Course as of 03/14/21 2021  Tue Mar 14, 2021  2007 I spoke to neurosurgery team - Dr Trenton Gammon to assess patient. [MT]    Clinical Course User Index [MT] Bernarda Erck, Carola Rhine, MD    Final Clinical Impression(s) / ED Diagnoses Traumatic subdural hematoma   Rx / DC Orders ED Discharge Orders     None        Wyvonnia Dusky, MD 03/15/21 1019

## 2021-03-14 NOTE — Progress Notes (Signed)
   03/14/21 1930  Clinical Encounter Type  Visited With Patient not available  Visit Type Trauma  Referral From Nurse  Consult/Referral To Chaplain   Chaplain responded. The medical team is attending to the patient.  There is no support person here at this time. Advise that the Chaplain remains available for follow-up spiritual and emotional support as needed. This note was prepared by Jeanine Luz, M.Div..  For questions please contact by phone [phone].

## 2021-03-14 NOTE — H&P (Signed)
Amy Cannon is an 75 y.o. female.   Chief Complaint: Head trauma HPI: 75 year old female was thrown from a horse earlier this evening.  No loss of consciousness but patient with significant headache.  She was helmeted but did strike her head with great force.  The headache is persisted.  She has not shown any signs of other injury.  She denies any shortness of breath or chest pain.  She is having no abdominal pain.  She has no neck or other spinal pain.  She has no complaints of numbness paresthesias or weakness.  She has had no seizure.  She does take aspirin but takes no other anticoagulants or antiplatelet agents.  Past Medical History:  Diagnosis Date   Arthritis    Cancer ([place])    BASAL CELL SKIN CANCER   Essential hypertension 05/03/2014   GERD (gastroesophageal reflux disease)    History of hiatal hernia    LARGE   HTN (hypertension)    stress test 08-04-2007 EF 70%; low risk scan , no significant ischemia   Hyperlipemia    Murmur, cardiac    2d-echo on 09-08-2012 EF  55-60% normal study, mild LVH, no significant valve disease   Pneumonia     Past Surgical History:  Procedure Laterality Date   CATARACT EXTRACTION W/ INTRAOCULAR LENS IMPLANT Bilateral    COLONOSCOPY     COLONOSCOPY WITH PROPOFOL N/A 01/13/2020   Procedure: COLONOSCOPY WITH PROPOFOL;  Surgeon: Robert Bellow, MD;  Location: [facility];  Service: Endoscopy;  Laterality: N/A;   complex repair wrist/hand/finger     ESOPHAGOGASTRODUODENOSCOPY     ESOPHAGOGASTRODUODENOSCOPY N/A 11/03/2020   Procedure: ESOPHAGOGASTRODUODENOSCOPY (EGD);  Surgeon: [name], DO;  Location: [facility];  Service: Gastroenterology;  Laterality: N/A;   ESOPHAGOGASTRODUODENOSCOPY (EGD) WITH PROPOFOL N/A 01/13/2020   Procedure: ESOPHAGOGASTRODUODENOSCOPY (EGD) WITH PROPOFOL;  Surgeon: Robert Bellow, MD;  Location: [facility];  Service: Endoscopy;  Laterality: N/A;   HIATAL HERNIA REPAIR N/A 11/03/2020   Procedure:  LAPAROSCOPIC REPAIR OF HIATAL HERNIA;  Surgeon: Greer Pickerel, MD;  Location: [facility];  Service: General;  Laterality: N/A;   LAPAROSCOPIC APPENDECTOMY N/A 04/01/2020   Procedure: APPENDECTOMY LAPAROSCOPIC;  Surgeon: Robert Bellow, MD;  Location: [facility];  Service: General;  Laterality: N/A;   MINOR IRRIGATION AND DEBRIDEMENT OF WOUND Left    TONSILLECTOMY     TRANSORAL INCISIONLESS FUNDOPLICATION N/A 0/45/4098   Procedure: TRANSORAL INCISIONLESS FUNDOPLICATION;  Surgeon: [name], DO;  Location: [facility];  Service: Gastroenterology;  Laterality: N/A;    Family History  Problem Relation Age of Onset   CAD Other    Cancer Mother        appendectomy and spreaded to colon   Colon cancer Mother    Esophageal cancer Neg Hx    Rectal cancer Neg Hx    Stomach cancer Neg Hx    Social History:  reports that she has quit smoking. She has never used smokeless tobacco. She reports current alcohol use of about 5.0 standard drinks per week. She reports that she does not use drugs.  Allergies:  Allergies  Allergen Reactions   Lisinopril Cough    (Not in a hospital admission)   No results found for this or any previous visit (from the past 48 hour(s)). CT Head Wo Contrast  Result Date: 03/14/2021 CLINICAL DATA:  Head trauma, minor (Age >= 65y) EXAM: CT HEAD WITHOUT CONTRAST TECHNIQUE: Contiguous axial images were obtained from the base of the skull through the  vertex without intravenous contrast. COMPARISON:  CT 05/05/2009. FINDINGS: Brain: Acute right cerebral convexity subdural hemorrhage measuring up to 5 mm in thickness. Small volume of hemorrhage extending along the right tentorial leaflet. Resulting mass effect with approximately 5 mm of leftward midline shift at the foramina [place]. Partial effacement of the right lateral ventricle. Multiple small areas of ill-defined area of hypoattenuation in the right frontal lobe (for example inferiorly on image 3, image 9; image 5, 25; and  anteriorly on series 3, image 11; series 5, image 10) and in the left subinsular region (series 3, image 11), likely similar to remote priors. No evidence of acute large vascular territory infarct, mass lesion. Basal cisterns are patent. Vascular: No hyperdense vessel identified. Calcific intracranial atherosclerosis. Skull: No acute fracture. Sinuses/Orbits: Partially imaged air-fluid level in the left maxillary sinus. Other: No mastoid effusions. IMPRESSION: 1. Acute 5 mm thick right cerebral convexity subdural hematoma with approximately 5 mm of leftward midline shift. Small volume of subdural hemorrhage along the right tentorial leaflet. 2. Multiple small areas of ill-defined area of hypoattenuation in the right frontal lobe and left subinsular white matter may represent chronic encephalomalcia given similar findings on prior CT head. An MRI could further characterize if clinically indicated. 3. Partially imaged air-fluid level in the left maxillary sinus. Dedicated maxillofacial CT could further evaluate for facial fracture if there has been facial trauma. Findings discussed wtih Dr. Ether Griffins telephone at 7:16 p.m. Electronically Signed   By: Margaretha Sheffield M.D.   On: 03/14/2021 19:22    Pertinent items noted in HPI and remainder of comprehensive ROS otherwise negative.  Blood pressure (!) 168/98, pulse 74, temperature 97.8 F (36.6 C), temperature source Oral, resp. rate 17, SpO2 99 %.  Patient is awake and alert.  She is oriented and appropriate.  Speech is fluent.  Judgment insight are intact.  Cranial nerve function normal lateral.  Motor examination 5/5 bilaterally.  No pronator drift.  Sensory examination nonfocal.  Deep intervals normal active.  No evidence of long track signs.  Examination head ears eyes nose and throat demonstrates no evidence of obvious external trauma.  Calvarium is intact.  Oropharynx, nasopharynx and external auditory canals are clear.  Neck is supple and nontender.   Range of motion is full.  Airway is midline.  Pulses are normal.  Chest is benign.  Breath sounds equal bilaterally.  Abdomen soft.  Extremities free from injury or deformity. Assessment/Plan Patient with significant traumatic brain injury and small right convexity subdural hematoma with significant hemispheric edema and mild right-to-left shift.  Plan to admit to the ICU for observation.  Plan follow-up head CT scan in morning.  Cooper Render Unique Searfoss 03/14/2021, 8:23 PM

## 2021-03-14 NOTE — ED Triage Notes (Signed)
Pt reports falling off a horse around noon today. Hx of a head bleed. Endorses 8/10 headache and nausea. No blood thinners.

## 2021-03-14 NOTE — ED Notes (Signed)
No answer for room assignment x1

## 2021-03-15 ENCOUNTER — Inpatient Hospital Stay (HOSPITAL_COMMUNITY): Payer: Medicare Other

## 2021-03-15 LAB — BASIC METABOLIC PANEL
Anion gap: 6 (ref 5–15)
BUN: 10 mg/dL (ref 8–23)
CO2: 24 mmol/L (ref 22–32)
Calcium: 8.9 mg/dL (ref 8.9–10.3)
Chloride: 103 mmol/L (ref 98–111)
Creatinine, Ser: 0.63 mg/dL (ref 0.44–1.00)
GFR, Estimated: >60 mL/min (ref >60)
Glucose, Bld: 130 mg/dL — ABNORMAL HIGH (ref 70–99)
Potassium: 3.5 mmol/L (ref 3.5–5.1)
Sodium: 133 mmol/L — ABNORMAL LOW (ref 135–145)

## 2021-03-15 LAB — CBC
HCT: 39.5 % (ref 36.0–46.0)
Hemoglobin: 13.1 g/dL (ref 12.0–15.0)
MCH: 30.4 pg (ref 26.0–34.0)
MCHC: 33.2 g/dL (ref 30.0–36.0)
MCV: 91.6 fL (ref 80.0–100.0)
Platelets: 233 10*3/uL (ref 150–400)
RBC: 4.31 MIL/uL (ref 3.87–5.11)
RDW: 12.7 % (ref 11.5–15.5)
WBC: 10.4 10*3/uL (ref 4.0–10.5)
nRBC: 0 % (ref 0.0–0.2)

## 2021-03-15 LAB — MRSA NEXT GEN BY PCR, NASAL: MRSA by PCR Next Gen: NOT DETECTED

## 2021-03-15 NOTE — Progress Notes (Signed)
Patient has done well overnight.  Not aware of any new problems.  Denies weakness or any speech or language problems.  She is afebrile.  Her vital signs are stable.  She is awake and alert.  She is oriented and appropriate.  Her speech is fluent.  Cranial nerve function demonstrates some minimal left-sided facial droop.  Motor examination of her extremities reveals very slight weakness exhibited by very minimal pronator drift on the left otherwise normal.  Follow-up head CT scan this morning demonstrates some moderate enlargement of her right convexity subdural hematoma.  This does cause a little bit more mass-effect but is not severe.  Patient with a significant right convexity subdural hematoma which has enlarged from presentation.  The patient clinically looks quite good and I have a hard time justifying any type of surgery unless her symptoms progress.  Plan to repeat head CT scan in morning.  If the subdural continues to enlarge or her mass-effect worsens we will discuss possible craniotomy and evacuation of clot.

## 2021-03-15 NOTE — Progress Notes (Signed)
Radiology called to inform this nurse of increase in SDH.  Patient neuro exam normal at this time. Reinaldo Meeker with Neurosurgery notified and aware. No orders at this time.

## 2021-03-15 NOTE — TOC CAGE-AID Note (Signed)
Transition of Care [facility]) - CAGE-AID Screening   Patient Details  Name: Amy Cannon MRN: [id number] Date of Birth: April 25, 1946  [facility] of [facility]) CM/SW Contact:    Gaetano Hawthorne Tarpley-Carter, LCSWA Phone Number: 03/15/2021, 12:04 PM   Clinical Narrative: Pt participated in [place].  Pt stated she does not use substance or ETOH.  Pt is a history of smoking and drinks ETOH socially.  Pt was not offered resources, due to no usage of substance or ETOH.     Haitham Dolinsky Tarpley-Carter, MSW, LCSW-A Pronouns:  She/Her/Hers [facility] HealthTransitions of Care Clinical Social Worker Direct Number:  [phone] [email or web address]   CAGE-AID Screening:    Have You Ever Felt You Ought to Cut Down on Your Drinking or Drug Use?: No Have People Annoyed You By [employer] Your Drinking Or Drug Use?: No Have You Felt Bad Or Guilty About Your Drinking Or Drug Use?: No Have You Ever Had a Drink or Used Drugs First Thing In The Morning to Steady Your Nerves or to Get Rid of a Hangover?: No CAGE-AID Score: 0  Substance Abuse Education Offered: Yes  Substance abuse interventions: Scientist, clinical (histocompatibility and immunogenetics)

## 2021-03-16 ENCOUNTER — Inpatient Hospital Stay (HOSPITAL_COMMUNITY): Payer: Medicare Other

## 2021-03-16 NOTE — Progress Notes (Signed)
No new events or problems overnight.  Patient with minimal if any headache.  Will occasionally take Tylenol.  Patient is not having any speech or language difficulty.  She is up ambulating without difficulty.  She denies any subjective feelings of weakness.  On examination she is awake and alert.  She has a minimal left-sided facial droop which I do not know whether this is chronic or not.  She has no pronator drift.  Her strength5/5 bilaterally.  Chest and abdomen are benign.  Follow-up head CT scan demonstrates no further enlargement of the subdural hematoma itself.  There is significant compression of the right hemisphere with ongoing right-to-left shift which is slightly worse than yesterday.  Patient with a significant right convexity acute subdural hematoma.  Overall she is doing very well clinically.  She has minimal if any symptoms.  I think that we can continue to observe her for now.  Should she show any signs of neurologic worsening then I would move forward with surgical evacuation however if she remains stable we may hopefully let this liquefy or possibly even resolve on its own.

## 2021-03-17 NOTE — Progress Notes (Signed)
Patient continues to do well.  She denies any headache.  She is having no symptoms of numbness paresthesias or weakness.  She is having no speech or language dysfunction.  She is having no neurocognitive issues.  She is afebrile.  Her vital signs are stable.  She is awake and alert.  She is oriented and appropriate.  Speech is fluent.  Judgment insight are intact.  She has some minimal left lower facial weakness while at rest.  She has normal motor strength in her facial nerve bilaterally.  Remainder of her cranial nerve function is normal.  Motor examination 5/5 bilaterally.  No pronator drift.  Patient with a significant right convexity subdural hematoma.  She is tolerating this well.  This size of the hematoma has been stable on follow-up scanning.  I think the patient may be safely discharged home with close observation by family and instructions to return should any new neurologic symptoms occur.  She voices understanding of this.

## 2021-03-17 NOTE — Discharge Instructions (Signed)
Light activity.  No lifting more than 5 pounds.  No driving.  Patient must be observed closely at all times.  Return to hospital for worsening headache, sensory changes, weakness, or speech or language problems.

## 2021-03-17 NOTE — Discharge Summary (Signed)
Physician Discharge Summary  Patient ID: Amy Cannon MRN: [id number] DOB/AGE: Dec 28, 1945 75 y.o.  Admit date: 03/14/2021 Discharge date: 03/17/2021  Admission Diagnoses:  Discharge Diagnoses:  Active Problems:   SDH (subdural hematoma)   Discharged Condition: good  Hospital Course: Patient admitted to the hospital after being thrown from a horse.  Work-up demonstrated evidence of a moderately small right convexity subdural hematoma.  The patient was neurologically intact.  She was admitted for observation.  Follow-up head CT scan demonstrated some interval enlargement of her subdural hematoma however the patient was completely asymptomatic.  Subsequent CT scan demonstrates stable appearance of the hematoma.  There is some mass-effect on her right hemisphere with some significant right to left shift.  Her basilar cisterns are wide open however.  The patient remains free of significant headache.  She is having no neurologic symptoms.  She is mobilizing around the unit.  She desires discharge home with close observation.  Consults:   Significant Diagnostic Studies:   Treatments:   Discharge Exam: Blood pressure (!) 147/99, pulse 66, temperature 97.7 F (36.5 C), temperature source Oral, resp. rate 15, height 5\' 5"  (1.651 m), SpO2 93 %. Awake and alert.  Oriented and appropriate.  Speech is fluent.  Judgment insight are intact.  Cranial nerve function normal except for some minimal left-sided lower facial weakness at rest but no weakness with voluntary movement.  Motor examination 5/5 bilaterally.  No pronator drift.  Chest and abdomen benign.  Extremities free of major deformity.  Disposition: Discharge disposition: 01-Home or Self Care        Allergies as of 03/17/2021       Reactions   Lisinopril Cough        Medication List     STOP taking these medications    aspirin 81 MG tablet   ondansetron 4 MG tablet Commonly known as: Zofran       TAKE these  medications    Alpha-Lipoic Acid 600 MG Caps Take 600 mg by mouth every evening.   B-12 PO Take 1,000 mcg by mouth daily.   COQ-10 PO Take 300 mg by mouth daily.   irbesartan-hydrochlorothiazide 300-12.5 MG tablet Commonly known as: AVALIDE Take 1 tablet by mouth daily. Please schedule appointment for refills. What changed:  when to take this additional instructions   Magnesium 400 MG Caps Take 400 mg by mouth 2 (two) times daily.   multivitamin with minerals Tabs tablet Take 1 tablet by mouth daily.   Olive Leaf Extract 250 MG Caps Take 250 mg by mouth daily.   OVER THE COUNTER MEDICATION Take 1 tablet by mouth daily. Bone Collagenizer otc supplement Vitamin C supplement   raloxifene 60 MG tablet Commonly known as: EVISTA Take 60 mg by mouth every evening.   REFRESH TEARS OP Place 1 drop into both eyes daily as needed (dry eyes).   RESVERATROL PO Take 1 tablet by mouth daily. Vitamin supplement   simethicone 80 MG chewable tablet Commonly known as: MYLICON Chew 1 tablet (80 mg total) by mouth every 6 (six) hours as needed for flatulence (gas, bloating, abdominal discomfort).   vitamin A 25000 UNIT capsule Take 25,000 Units by mouth every Monday, Wednesday, and Friday.   vitamin C 500 MG tablet Commonly known as: ASCORBIC ACID Take 500 mg by mouth daily.   Vitamin D3 10 MCG (400 UNIT) Caps Take 400 Units by mouth daily.   VITAMIN-B COMPLEX PO Take 1 tablet by mouth daily.  Signed: [name] 03/17/2021, 11:51 AM

## 2021-03-20 ENCOUNTER — Other Ambulatory Visit (HOSPITAL_COMMUNITY): Payer: Self-pay | Admitting: Neurosurgery

## 2021-03-20 DIAGNOSIS — S065XAA Traumatic subdural hemorrhage with loss of consciousness status unknown, initial encounter: Secondary | ICD-10-CM

## 2021-03-22 ENCOUNTER — Other Ambulatory Visit: Payer: Self-pay

## 2021-03-22 ENCOUNTER — Ambulatory Visit (HOSPITAL_COMMUNITY)
Admission: RE | Admit: 2021-03-22 | Discharge: 2021-03-22 | Disposition: A | Discharge status: Home / Self Care | Payer: Medicare Other | Source: Ambulatory Visit | Attending: Neurosurgery | Admitting: Neurosurgery

## 2021-03-22 DIAGNOSIS — S065XAA Traumatic subdural hemorrhage with loss of consciousness status unknown, initial encounter: Secondary | ICD-10-CM | POA: Diagnosis not present

## 2021-03-23 ENCOUNTER — Other Ambulatory Visit: Payer: Self-pay | Admitting: Neurosurgery

## 2021-03-23 ENCOUNTER — Other Ambulatory Visit (HOSPITAL_COMMUNITY): Payer: Self-pay | Admitting: Neurosurgery

## 2021-03-23 DIAGNOSIS — S065XAA Traumatic subdural hemorrhage with loss of consciousness status unknown, initial encounter: Secondary | ICD-10-CM

## 2021-03-30 ENCOUNTER — Other Ambulatory Visit: Payer: Self-pay | Admitting: Neurology

## 2021-03-30 DIAGNOSIS — S065XAA Traumatic subdural hemorrhage with loss of consciousness status unknown, initial encounter: Secondary | ICD-10-CM

## 2021-04-04 ENCOUNTER — Emergency Department (HOSPITAL_COMMUNITY): Payer: Medicare Other

## 2021-04-04 ENCOUNTER — Encounter (HOSPITAL_COMMUNITY): Payer: Self-pay | Admitting: Emergency Medicine

## 2021-04-04 ENCOUNTER — Inpatient Hospital Stay (HOSPITAL_COMMUNITY)
Admission: EM | Admit: 2021-04-04 | Discharge: 2021-04-08 | DRG: 025 | Disposition: A | Discharge status: Home / Self Care | Payer: Medicare Other | Attending: Neurosurgery | Admitting: Neurosurgery

## 2021-04-04 ENCOUNTER — Other Ambulatory Visit: Payer: Self-pay

## 2021-04-04 DIAGNOSIS — I10 Essential (primary) hypertension: Secondary | ICD-10-CM | POA: Diagnosis present

## 2021-04-04 DIAGNOSIS — Z8249 Family history of ischemic heart disease and other diseases of the circulatory system: Secondary | ICD-10-CM

## 2021-04-04 DIAGNOSIS — E785 Hyperlipidemia, unspecified: Secondary | ICD-10-CM | POA: Diagnosis present

## 2021-04-04 DIAGNOSIS — I6203 Nontraumatic chronic subdural hemorrhage: Secondary | ICD-10-CM | POA: Diagnosis not present

## 2021-04-04 DIAGNOSIS — Z85828 Personal history of other malignant neoplasm of skin: Secondary | ICD-10-CM

## 2021-04-04 DIAGNOSIS — Z888 Allergy status to other drugs, medicaments and biological substances status: Secondary | ICD-10-CM

## 2021-04-04 DIAGNOSIS — Z20822 Contact with and (suspected) exposure to covid-19: Secondary | ICD-10-CM | POA: Diagnosis present

## 2021-04-04 DIAGNOSIS — X58XXXA Exposure to other specified factors, initial encounter: Secondary | ICD-10-CM | POA: Diagnosis present

## 2021-04-04 DIAGNOSIS — G935 Compression of brain: Secondary | ICD-10-CM | POA: Diagnosis present

## 2021-04-04 DIAGNOSIS — Z87891 Personal history of nicotine dependence: Secondary | ICD-10-CM

## 2021-04-04 DIAGNOSIS — S0501XA Injury of conjunctiva and corneal abrasion without foreign body, right eye, initial encounter: Secondary | ICD-10-CM | POA: Diagnosis present

## 2021-04-04 DIAGNOSIS — K219 Gastro-esophageal reflux disease without esophagitis: Secondary | ICD-10-CM | POA: Diagnosis present

## 2021-04-04 DIAGNOSIS — S065XAA Traumatic subdural hemorrhage with loss of consciousness status unknown, initial encounter: Principal | ICD-10-CM | POA: Diagnosis present

## 2021-04-04 MED ORDER — B COMPLEX-C PO TABS
1.0000 | ORAL_TABLET | Freq: Every day | ORAL | Status: DC
  Administered 2021-04-06 – 2021-04-08 (×3): 1 via ORAL
  Filled 2021-04-04 (×4): qty 1

## 2021-04-04 MED ORDER — ACETAMINOPHEN 325 MG PO TABS: 650.0000 mg | ORAL_TABLET | ORAL | Status: DC | PRN

## 2021-04-04 MED ORDER — VITAMIN A 3 MG (10000 UNIT) PO CAPS
20000.0000 [IU] | ORAL_CAPSULE | ORAL | Status: DC
Start: 2021-04-05 — End: 2021-04-08
  Administered 2021-04-07: 20000 [IU] via ORAL
  Filled 2021-04-04 (×2): qty 2

## 2021-04-04 MED ORDER — VITAMIN D 25 MCG (1000 UNIT) PO TABS
500.0000 [IU] | ORAL_TABLET | Freq: Every day | ORAL | Status: DC
  Administered 2021-04-06 – 2021-04-08 (×3): 500 [IU] via ORAL
  Filled 2021-04-04 (×4): qty 1

## 2021-04-04 MED ORDER — HYDROCODONE-ACETAMINOPHEN 5-325 MG PO TABS
1.0000 | ORAL_TABLET | ORAL | Status: DC | PRN
  Administered 2021-04-07 – 2021-04-08 (×2): 1 via ORAL
  Filled 2021-04-04 (×2): qty 1

## 2021-04-04 MED ORDER — MAGNESIUM OXIDE -MG SUPPLEMENT 400 (240 MG) MG PO TABS
400.0000 mg | ORAL_TABLET | Freq: Two times a day (BID) | ORAL | Status: DC
  Administered 2021-04-05 – 2021-04-08 (×7): 400 mg via ORAL
  Filled 2021-04-04 (×8): qty 1

## 2021-04-04 MED ORDER — RALOXIFENE HCL 60 MG PO TABS
60.0000 mg | ORAL_TABLET | Freq: Every evening | ORAL | Status: DC
  Administered 2021-04-05 – 2021-04-07 (×3): 60 mg via ORAL
  Filled 2021-04-04 (×4): qty 1

## 2021-04-04 MED ORDER — IRBESARTAN-HYDROCHLOROTHIAZIDE 300-12.5 MG PO TABS: 1.0000 | ORAL_TABLET | Freq: Every day | ORAL | Status: DC

## 2021-04-04 MED ORDER — SODIUM CHLORIDE 0.9 % IV SOLN: INTRAVENOUS | Status: DC

## 2021-04-04 MED ORDER — POLYVINYL ALCOHOL 1.4 % OP SOLN
1.0000 [drp] | Freq: Every day | OPHTHALMIC | Status: DC | PRN
  Administered 2021-04-07: 1 [drp] via OPHTHALMIC
  Filled 2021-04-04 (×2): qty 15

## 2021-04-04 MED ORDER — HYDROCODONE-ACETAMINOPHEN 5-325 MG PO TABS
2.0000 | ORAL_TABLET | ORAL | Status: DC | PRN
  Administered 2021-04-05 – 2021-04-06 (×6): 2 via ORAL
  Filled 2021-04-04 (×6): qty 2

## 2021-04-04 MED ORDER — VITAMIN B-12 1000 MCG PO TABS
1000.0000 ug | ORAL_TABLET | Freq: Every day | ORAL | Status: DC
  Administered 2021-04-06 – 2021-04-08 (×3): 1000 ug via ORAL
  Filled 2021-04-04 (×4): qty 1

## 2021-04-04 MED ORDER — ONDANSETRON HCL 4 MG PO TABS: 4.0000 mg | ORAL_TABLET | Freq: Four times a day (QID) | ORAL | Status: DC | PRN

## 2021-04-04 MED ORDER — SIMETHICONE 80 MG PO CHEW
80.0000 mg | CHEWABLE_TABLET | Freq: Four times a day (QID) | ORAL | Status: DC | PRN
  Administered 2021-04-06: 02:00:00 80 mg via ORAL
  Filled 2021-04-04: qty 1

## 2021-04-04 MED ORDER — ADULT MULTIVITAMIN W/MINERALS CH
1.0000 | ORAL_TABLET | Freq: Every day | ORAL | Status: DC
  Administered 2021-04-06 – 2021-04-08 (×3): 1 via ORAL
  Filled 2021-04-04 (×5): qty 1

## 2021-04-04 MED ORDER — ACETAMINOPHEN 500 MG PO TABS
1000.0000 mg | ORAL_TABLET | Freq: Once | ORAL | Status: AC
  Administered 2021-04-04: 1000 mg via ORAL
  Filled 2021-04-04: qty 2

## 2021-04-04 MED ORDER — ASCORBIC ACID 500 MG PO TABS
500.0000 mg | ORAL_TABLET | Freq: Every day | ORAL | Status: DC
  Administered 2021-04-06 – 2021-04-08 (×3): 500 mg via ORAL
  Filled 2021-04-04 (×4): qty 1

## 2021-04-04 MED ORDER — ONDANSETRON HCL 4 MG/2ML IJ SOLN: 4.0000 mg | Freq: Four times a day (QID) | INTRAMUSCULAR | Status: DC | PRN

## 2021-04-04 NOTE — ED Provider Notes (Addendum)
[facility] Emergency Department Provider Note MRN:  [id number]  Arrival date & time: 04/04/21     Chief Complaint   Altered Mental Status   History of Present Illness   Amy Cannon is a 75 y.o. year-old female with a history of hypertension presenting to the ED with chief complaint of altered mental status.  Patient recently admitted for a subdural hematoma after falling off of a horse.  Since discharge, over the past few days, she has been waking up with left-sided headaches.  She did not have significant headaches during her admission according to the documentation.  She has also been exhibiting increased confusion per husband.  Sleeping a lot as well.  No nausea or vomiting, some mild neck soreness, no chest pain or shortness of breath, no back pain, no abdominal pain.  No numbness or weakness to the arms or legs.  Symptoms are mild to moderate, constant, worsening.  Review of Systems  A complete 10 system review of systems was obtained and all systems are negative except as noted in the HPI and PMH.   Patient's Health History    Past Medical History:  Diagnosis Date   Arthritis    Cancer ([place])    BASAL CELL SKIN CANCER   Essential hypertension 05/03/2014   GERD (gastroesophageal reflux disease)    History of hiatal hernia    LARGE   HTN (hypertension)    stress test 08-04-2007 EF 70%; low risk scan , no significant ischemia   Hyperlipemia    Murmur, cardiac    2d-echo on 09-08-2012 EF  55-60% normal study, mild LVH, no significant valve disease   Pneumonia     Past Surgical History:  Procedure Laterality Date   CATARACT EXTRACTION W/ INTRAOCULAR LENS IMPLANT Bilateral    COLONOSCOPY     COLONOSCOPY WITH PROPOFOL N/A 01/13/2020   Procedure: COLONOSCOPY WITH PROPOFOL;  Surgeon: Robert Bellow, MD;  Location: [place];  Service: Endoscopy;  Laterality: N/A;   complex repair wrist/hand/finger     ESOPHAGOGASTRODUODENOSCOPY      ESOPHAGOGASTRODUODENOSCOPY N/A 11/03/2020   Procedure: ESOPHAGOGASTRODUODENOSCOPY (EGD);  Surgeon: [name], DO;  Location: [facility];  Service: Gastroenterology;  Laterality: N/A;   ESOPHAGOGASTRODUODENOSCOPY (EGD) WITH PROPOFOL N/A 01/13/2020   Procedure: ESOPHAGOGASTRODUODENOSCOPY (EGD) WITH PROPOFOL;  Surgeon: Robert Bellow, MD;  Location: [facility];  Service: Endoscopy;  Laterality: N/A;   HIATAL HERNIA REPAIR N/A 11/03/2020   Procedure: LAPAROSCOPIC REPAIR OF HIATAL HERNIA;  Surgeon: Greer Pickerel, MD;  Location: [facility];  Service: General;  Laterality: N/A;   LAPAROSCOPIC APPENDECTOMY N/A 04/01/2020   Procedure: APPENDECTOMY LAPAROSCOPIC;  Surgeon: Robert Bellow, MD;  Location: [facility];  Service: General;  Laterality: N/A;   MINOR IRRIGATION AND DEBRIDEMENT OF WOUND Left    TONSILLECTOMY     TRANSORAL INCISIONLESS FUNDOPLICATION N/A 09/14/8339   Procedure: TRANSORAL INCISIONLESS FUNDOPLICATION;  Surgeon: [name], DO;  Location: [facility];  Service: Gastroenterology;  Laterality: N/A;    Family History  Problem Relation Age of Onset   CAD Other    Cancer Mother        appendectomy and spreaded to colon   Colon cancer Mother    Esophageal cancer Neg Hx    Rectal cancer Neg Hx    Stomach cancer Neg Hx     Social History   Socioeconomic History   Marital status: Widowed    Spouse name: Not on file   Number of children: Not on file  Years of education: Not on file   Highest education level: Not on file  Occupational History   Not on file  Tobacco Use   Smoking status: Former   Smokeless tobacco: Never  Vaping Use   Vaping Use: Never used  Substance and Sexual Activity   Alcohol use: Yes    Alcohol/week: 5.0 standard drinks    Types: 5 Standard drinks or equivalent per week    Comment: SOCIALLY   Drug use: No   Sexual activity: Not on file  Other Topics Concern   Not on file  Social History Narrative   Not on file   Social Determinants of  Health   Financial Resource Strain: Not on file  Food Insecurity: Not on file  Transportation Needs: Not on file  Physical Activity: Not on file  Stress: Not on file  Social Connections: Not on file  Intimate Partner Violence: Not on file     Physical Exam   Vitals:   04/04/21 2107 04/04/21 2330  BP: (!) 147/92 (!) 163/92  Pulse: 70 65  Resp: 16 14  Temp: 98.8 F (37.1 C)   SpO2: 96% 98%    CONSTITUTIONAL: Well-appearing, NAD NEURO:  Alert and oriented x 3, normal symmetric strength and sensation, normal coordination, normal speech, does exhibit subtle confusion during conversation and exam EYES:  eyes equal and reactive ENT/NECK:  no LAD, no JVD CARDIO: Regular rate, well-perfused, normal S1 and S2 PULM:  CTAB no wheezing or rhonchi GI/GU:  normal bowel sounds, non-distended, non-tender MSK/SPINE:  No gross deformities, no edema SKIN:  no rash, atraumatic PSYCH:  Appropriate speech and behavior  *Additional and/or pertinent findings included in MDM below  Diagnostic and Interventional Summary    EKG Interpretation  Date/Time:    Ventricular Rate:    PR Interval:    QRS Duration:   QT Interval:    QTC Calculation:   R Axis:     Text Interpretation:         Labs Reviewed  RESP PANEL BY RT-PCR (FLU A&B, COVID) ARPGX2  COMPREHENSIVE METABOLIC PANEL  CBC  URINALYSIS, ROUTINE W REFLEX MICROSCOPIC    CT Head Wo Contrast  Final Result      Medications  acetaminophen (TYLENOL) tablet 1,000 mg (1,000 mg Oral Given 04/04/21 2339)     Procedures  /  Critical Care .Critical Care Performed by: Maudie Flakes, MD Authorized by: Maudie Flakes, MD   Critical care provider statement:    Critical care time (minutes):  33   Critical care was necessary to treat or prevent imminent or life-threatening deterioration of the following conditions: Subdural hematoma.   Critical care was time spent personally by me on the following activities:  Development of  treatment plan with patient or surrogate, discussions with consultants, evaluation of patient's response to treatment, examination of patient, ordering and review of laboratory studies, ordering and review of radiographic studies, ordering and performing treatments and interventions, pulse oximetry, re-evaluation of patient's condition and review of old charts  ED Course and Medical Decision Making  I have reviewed the triage vital signs, the nursing notes, and pertinent available records from the EMR.  Listed above are laboratory and imaging tests that I personally ordered, reviewed, and interpreted and then considered in my medical decision making (see below for details).  Worsening symptoms, recent subdural, CT revealing an increase in the size of the subdural as well as small increase in the midline shift.  Will consult with neurosurgery.  Discussed case with Dr. Kathyrn Sheriff, patient will be admitted to to the neurosurgery service.  Patient is exhibiting some hypertension, if persists will consider Cleviprex.  Patient had not taking her nighttime meds which includes her BP meds, blood pressure is now improved after her home BP meds.  Barth Kirks. [place]onia Small, [place] [email or web address]  Final Clinical Impressions(s) / ED Diagnoses     ICD-10-CM   1. Subdural hematoma  S06.[place]       ED Discharge Orders     None        Discharge Instructions Discussed with and Provided to Patient:   Discharge Instructions   None       Maudie Flakes, MD 04/04/21 2352    Maudie Flakes, MD 04/05/21 [phone]

## 2021-04-04 NOTE — ED Triage Notes (Signed)
Pt presents with increased confusion and short term memory loss after recent admission for subdural after fall from  horse at the end of October. No n/v.  Pain to left side of head.

## 2021-04-04 NOTE — ED Provider Notes (Signed)
Emergency Medicine Provider Triage Evaluation Note  Amy Cannon , a 76 y.o. female  was evaluated in triage.  Pt complains of she had a recent subdural nonoperative after a fall.  She is being brought in by her husband for 2 weeks of progressively worsening short-term memory.  Also had a left temporal headache  Review of Systems  Positive: Headache Negative: Vomiting diarrhea  Physical Exam  BP (!) 147/92 (BP Location: Right Arm)   Pulse 70   Temp 98.8 F (37.1 C) (Oral)   Resp 16   Ht 5\' 5"  (1.651 m)   Wt 64.9 kg   SpO2 96%   BMI 23.80 kg/m  Gen:   Awake, no distress   Resp:  Normal effort  MSK:   Moves extremities without difficulty  Other:    Medical Decision Making  Medically screening exam initiated at 9:14 PM.  Appropriate orders placed.  Gabrial Kallenbach was informed that the remainder of the evaluation will be completed by another provider, this initial triage assessment does not replace that evaluation, and the importance of remaining in the ED until their evaluation is complete.     Hayden Rasmussen, MD 04/05/21 [phone]

## 2021-04-05 ENCOUNTER — Inpatient Hospital Stay (HOSPITAL_COMMUNITY): Payer: Medicare Other | Admitting: Anesthesiology

## 2021-04-05 ENCOUNTER — Encounter (HOSPITAL_COMMUNITY)
Admission: EM | Disposition: A | Discharge status: Home / Self Care | Payer: Self-pay | Source: Home / Self Care | Attending: Neurosurgery

## 2021-04-05 ENCOUNTER — Encounter (HOSPITAL_COMMUNITY): Payer: Self-pay | Admitting: Neurosurgery

## 2021-04-05 DIAGNOSIS — Z87891 Personal history of nicotine dependence: Secondary | ICD-10-CM | POA: Diagnosis not present

## 2021-04-05 DIAGNOSIS — X58XXXA Exposure to other specified factors, initial encounter: Secondary | ICD-10-CM | POA: Diagnosis present

## 2021-04-05 DIAGNOSIS — E785 Hyperlipidemia, unspecified: Secondary | ICD-10-CM | POA: Diagnosis present

## 2021-04-05 DIAGNOSIS — I10 Essential (primary) hypertension: Secondary | ICD-10-CM | POA: Diagnosis present

## 2021-04-05 DIAGNOSIS — K219 Gastro-esophageal reflux disease without esophagitis: Secondary | ICD-10-CM | POA: Diagnosis present

## 2021-04-05 DIAGNOSIS — S0501XA Injury of conjunctiva and corneal abrasion without foreign body, right eye, initial encounter: Secondary | ICD-10-CM | POA: Diagnosis present

## 2021-04-05 DIAGNOSIS — G935 Compression of brain: Secondary | ICD-10-CM | POA: Diagnosis present

## 2021-04-05 DIAGNOSIS — I6203 Nontraumatic chronic subdural hemorrhage: Secondary | ICD-10-CM | POA: Diagnosis present

## 2021-04-05 DIAGNOSIS — Z20822 Contact with and (suspected) exposure to covid-19: Secondary | ICD-10-CM | POA: Diagnosis present

## 2021-04-05 DIAGNOSIS — Z85828 Personal history of other malignant neoplasm of skin: Secondary | ICD-10-CM | POA: Diagnosis not present

## 2021-04-05 DIAGNOSIS — Z888 Allergy status to other drugs, medicaments and biological substances status: Secondary | ICD-10-CM | POA: Diagnosis not present

## 2021-04-05 DIAGNOSIS — Z8249 Family history of ischemic heart disease and other diseases of the circulatory system: Secondary | ICD-10-CM | POA: Diagnosis not present

## 2021-04-05 HISTORY — PX: BURR HOLE: SHX908

## 2021-04-05 LAB — CBC
HCT: 41.2 % (ref 36.0–46.0)
Hemoglobin: 13.2 g/dL (ref 12.0–15.0)
MCH: 30.2 pg (ref 26.0–34.0)
MCHC: 32 g/dL (ref 30.0–36.0)
MCV: 94.3 fL (ref 80.0–100.0)
Platelets: 324 10*3/uL (ref 150–400)
RBC: 4.37 MIL/uL (ref 3.87–5.11)
RDW: 12.7 % (ref 11.5–15.5)
WBC: 7.7 10*3/uL (ref 4.0–10.5)
nRBC: 0 % (ref 0.0–0.2)

## 2021-04-05 LAB — URINALYSIS, ROUTINE W REFLEX MICROSCOPIC
Bacteria, UA: NONE SEEN
Bilirubin Urine: NEGATIVE
Glucose, UA: NEGATIVE mg/dL
Hgb urine dipstick: NEGATIVE
Ketones, ur: NEGATIVE mg/dL
Nitrite: NEGATIVE
Protein, ur: NEGATIVE mg/dL
Specific Gravity, Urine: 1.006 (ref 1.005–1.030)
pH: 6 (ref 5.0–8.0)

## 2021-04-05 LAB — COMPREHENSIVE METABOLIC PANEL
ALT: 8 U/L (ref 0–44)
AST: 15 U/L (ref 15–41)
Albumin: 3.3 g/dL — ABNORMAL LOW (ref 3.5–5.0)
Alkaline Phosphatase: 72 U/L (ref 38–126)
Anion gap: 6 (ref 5–15)
BUN: 13 mg/dL (ref 8–23)
CO2: 22 mmol/L (ref 22–32)
Calcium: 9.5 mg/dL (ref 8.9–10.3)
Chloride: 111 mmol/L (ref 98–111)
Creatinine, Ser: 0.68 mg/dL (ref 0.44–1.00)
GFR, Estimated: >60 mL/min (ref >60)
Glucose, Bld: 97 mg/dL (ref 70–99)
Potassium: 4.2 mmol/L (ref 3.5–5.1)
Sodium: 139 mmol/L (ref 135–145)
Total Bilirubin: 0.7 mg/dL (ref 0.3–1.2)
Total Protein: 5.8 g/dL — ABNORMAL LOW (ref 6.5–8.1)

## 2021-04-05 LAB — MRSA NEXT GEN BY PCR, NASAL: MRSA by PCR Next Gen: NOT DETECTED

## 2021-04-05 LAB — RESP PANEL BY RT-PCR (FLU A&B, COVID) ARPGX2
Influenza A by PCR: NEGATIVE
Influenza B by PCR: NEGATIVE
SARS Coronavirus 2 by RT PCR: NEGATIVE

## 2021-04-05 SURGERY — CREATION, CRANIAL BURR HOLE
Anesthesia: General | Laterality: Right

## 2021-04-05 MED ORDER — FENTANYL CITRATE (PF) 100 MCG/2ML IJ SOLN
25.0000 ug | INTRAMUSCULAR | Status: DC | PRN
  Administered 2021-04-05: 25 ug via INTRAVENOUS

## 2021-04-05 MED ORDER — LIDOCAINE-EPINEPHRINE 1 %-1:100000 IJ SOLN
INTRAMUSCULAR | Status: DC | PRN
  Administered 2021-04-05: 10 mL

## 2021-04-05 MED ORDER — DEXAMETHASONE SODIUM PHOSPHATE 10 MG/ML IJ SOLN
INTRAMUSCULAR | Status: AC
  Filled 2021-04-05: qty 1

## 2021-04-05 MED ORDER — THROMBIN 5000 UNITS EX SOLR
CUTANEOUS | Status: AC
  Filled 2021-04-05: qty 5000

## 2021-04-05 MED ORDER — EPHEDRINE SULFATE-NACL 50-0.9 MG/10ML-% IV SOSY
PREFILLED_SYRINGE | INTRAVENOUS | Status: DC | PRN
  Administered 2021-04-05: 5 mg via INTRAVENOUS

## 2021-04-05 MED ORDER — PANTOPRAZOLE SODIUM 40 MG IV SOLR
40.0000 mg | Freq: Every day | INTRAVENOUS | Status: DC
  Administered 2021-04-05: 40 mg via INTRAVENOUS
  Filled 2021-04-05: qty 40

## 2021-04-05 MED ORDER — VANCOMYCIN HCL 1000 MG IV SOLR
INTRAVENOUS | Status: AC
  Filled 2021-04-05: qty 20

## 2021-04-05 MED ORDER — ONDANSETRON HCL 4 MG/2ML IJ SOLN
INTRAMUSCULAR | Status: DC | PRN
  Administered 2021-04-05: 4 mg via INTRAVENOUS

## 2021-04-05 MED ORDER — FENTANYL CITRATE (PF) 100 MCG/2ML IJ SOLN
INTRAMUSCULAR | Status: AC
  Filled 2021-04-05: qty 2

## 2021-04-05 MED ORDER — CEFAZOLIN SODIUM-DEXTROSE 2-4 GM/100ML-% IV SOLN
INTRAVENOUS | Status: AC
  Filled 2021-04-05: qty 100

## 2021-04-05 MED ORDER — BACITRACIN ZINC 500 UNIT/GM EX OINT
TOPICAL_OINTMENT | CUTANEOUS | Status: AC
  Filled 2021-04-05: qty 28.35

## 2021-04-05 MED ORDER — LABETALOL HCL 5 MG/ML IV SOLN: 10.0000 mg | INTRAVENOUS | Status: DC | PRN

## 2021-04-05 MED ORDER — CHLORHEXIDINE GLUCONATE 0.12 % MT SOLN
OROMUCOSAL | Status: AC
  Administered 2021-04-05: 15 mL via OROMUCOSAL
  Filled 2021-04-05: qty 15

## 2021-04-05 MED ORDER — THROMBIN 20000 UNITS EX SOLR
CUTANEOUS | Status: DC | PRN
  Administered 2021-04-05: 20 mL via TOPICAL

## 2021-04-05 MED ORDER — LIDOCAINE-EPINEPHRINE 1 %-1:100000 IJ SOLN
INTRAMUSCULAR | Status: AC
  Filled 2021-04-05: qty 1

## 2021-04-05 MED ORDER — CEFAZOLIN SODIUM-DEXTROSE 1-4 GM/50ML-% IV SOLN
1.0000 g | Freq: Three times a day (TID) | INTRAVENOUS | Status: AC
  Administered 2021-04-05 – 2021-04-06 (×2): 1 g via INTRAVENOUS
  Filled 2021-04-05 (×2): qty 50

## 2021-04-05 MED ORDER — HYDROMORPHONE HCL 1 MG/ML IJ SOLN
0.5000 mg | INTRAMUSCULAR | Status: DC | PRN
  Filled 2021-04-05: qty 1

## 2021-04-05 MED ORDER — THROMBIN 20000 UNITS EX SOLR
CUTANEOUS | Status: AC
  Filled 2021-04-05: qty 20000

## 2021-04-05 MED ORDER — CEFAZOLIN SODIUM-DEXTROSE 2-4 GM/100ML-% IV SOLN
2.0000 g | INTRAVENOUS | Status: AC
  Administered 2021-04-05: 2 g via INTRAVENOUS

## 2021-04-05 MED ORDER — HYDROCHLOROTHIAZIDE 12.5 MG PO TABS: 12.5000 mg | ORAL_TABLET | Freq: Every day | ORAL | Status: DC

## 2021-04-05 MED ORDER — LEVETIRACETAM IN NACL 1000 MG/100ML IV SOLN
1000.0000 mg | Freq: Two times a day (BID) | INTRAVENOUS | Status: DC
Start: 2021-04-05 — End: 2021-04-05
  Administered 2021-04-05: 1000 mg via INTRAVENOUS
  Filled 2021-04-05: qty 100

## 2021-04-05 MED ORDER — SUGAMMADEX SODIUM 200 MG/2ML IV SOLN
INTRAVENOUS | Status: DC | PRN
  Administered 2021-04-05: 200 mg via INTRAVENOUS

## 2021-04-05 MED ORDER — LIDOCAINE 2% (20 MG/ML) 5 ML SYRINGE
INTRAMUSCULAR | Status: DC | PRN
  Administered 2021-04-05: 60 mg via INTRAVENOUS

## 2021-04-05 MED ORDER — HYDROCHLOROTHIAZIDE 12.5 MG PO TABS
12.5000 mg | ORAL_TABLET | Freq: Every day | ORAL | Status: DC
  Administered 2021-04-05 – 2021-04-07 (×4): 12.5 mg via ORAL
  Filled 2021-04-05 (×4): qty 1

## 2021-04-05 MED ORDER — DEXAMETHASONE SODIUM PHOSPHATE 10 MG/ML IJ SOLN
10.0000 mg | Freq: Once | INTRAMUSCULAR | Status: AC
  Administered 2021-04-05: 10 mg via INTRAVENOUS
  Filled 2021-04-05: qty 1

## 2021-04-05 MED ORDER — 0.9 % SODIUM CHLORIDE (POUR BTL) OPTIME
TOPICAL | Status: DC | PRN
  Administered 2021-04-05: 2000 mL

## 2021-04-05 MED ORDER — ROCURONIUM BROMIDE 10 MG/ML (PF) SYRINGE
PREFILLED_SYRINGE | INTRAVENOUS | Status: DC | PRN
  Administered 2021-04-05: 60 mg via INTRAVENOUS

## 2021-04-05 MED ORDER — PROPOFOL 10 MG/ML IV BOLUS
INTRAVENOUS | Status: AC
  Filled 2021-04-05: qty 20

## 2021-04-05 MED ORDER — ONDANSETRON HCL 4 MG/2ML IJ SOLN: 4.0000 mg | INTRAMUSCULAR | Status: DC | PRN

## 2021-04-05 MED ORDER — IRBESARTAN 300 MG PO TABS
300.0000 mg | ORAL_TABLET | Freq: Every day | ORAL | Status: DC
  Administered 2021-04-05 – 2021-04-07 (×4): 300 mg via ORAL
  Filled 2021-04-05: qty 2
  Filled 2021-04-05 (×2): qty 1
  Filled 2021-04-05: qty 2

## 2021-04-05 MED ORDER — PROMETHAZINE HCL 25 MG PO TABS: 12.5000 mg | ORAL_TABLET | ORAL | Status: DC | PRN

## 2021-04-05 MED ORDER — IRBESARTAN 300 MG PO TABS
300.0000 mg | ORAL_TABLET | Freq: Every day | ORAL | Status: DC
Start: 2021-04-05 — End: 2021-04-05

## 2021-04-05 MED ORDER — FENTANYL CITRATE (PF) 250 MCG/5ML IJ SOLN
INTRAMUSCULAR | Status: AC
  Filled 2021-04-05: qty 5

## 2021-04-05 MED ORDER — DEXAMETHASONE SODIUM PHOSPHATE 4 MG/ML IJ SOLN
4.0000 mg | Freq: Four times a day (QID) | INTRAMUSCULAR | Status: DC
  Administered 2021-04-06 – 2021-04-07 (×3): 4 mg via INTRAVENOUS
  Filled 2021-04-05 (×2): qty 1

## 2021-04-05 MED ORDER — ONDANSETRON HCL 4 MG PO TABS: 4.0000 mg | ORAL_TABLET | ORAL | Status: DC | PRN

## 2021-04-05 MED ORDER — LEVETIRACETAM IN NACL 500 MG/100ML IV SOLN
500.0000 mg | Freq: Two times a day (BID) | INTRAVENOUS | Status: DC
  Administered 2021-04-05: 500 mg via INTRAVENOUS
  Filled 2021-04-05: qty 100

## 2021-04-05 MED ORDER — PHENYLEPHRINE 40 MCG/ML (10ML) SYRINGE FOR IV PUSH (FOR BLOOD PRESSURE SUPPORT)
PREFILLED_SYRINGE | INTRAVENOUS | Status: DC | PRN
  Administered 2021-04-05 (×2): 80 ug via INTRAVENOUS
  Administered 2021-04-05: 120 ug via INTRAVENOUS

## 2021-04-05 MED ORDER — DEXAMETHASONE SODIUM PHOSPHATE 10 MG/ML IJ SOLN
6.0000 mg | Freq: Four times a day (QID) | INTRAMUSCULAR | Status: AC
  Administered 2021-04-05 – 2021-04-06 (×4): 6 mg via INTRAVENOUS
  Filled 2021-04-05 (×4): qty 1

## 2021-04-05 MED ORDER — ONDANSETRON HCL 4 MG/2ML IJ SOLN: 4.0000 mg | Freq: Once | INTRAMUSCULAR | Status: DC | PRN

## 2021-04-05 MED ORDER — PROPOFOL 10 MG/ML IV BOLUS
INTRAVENOUS | Status: DC | PRN
  Administered 2021-04-05: 100 mg via INTRAVENOUS
  Administered 2021-04-05: 30 mg via INTRAVENOUS

## 2021-04-05 MED ORDER — DEXAMETHASONE SODIUM PHOSPHATE 4 MG/ML IJ SOLN
4.0000 mg | Freq: Three times a day (TID) | INTRAMUSCULAR | Status: DC
  Filled 2021-04-05: qty 1

## 2021-04-05 MED ORDER — THROMBIN 5000 UNITS EX SOLR
OROMUCOSAL | Status: DC | PRN
  Administered 2021-04-05: 5 mL via TOPICAL

## 2021-04-05 MED ORDER — LACTATED RINGERS IV SOLN: INTRAVENOUS | Status: DC

## 2021-04-05 MED ORDER — PHENYLEPHRINE 40 MCG/ML (10ML) SYRINGE FOR IV PUSH (FOR BLOOD PRESSURE SUPPORT)
PREFILLED_SYRINGE | INTRAVENOUS | Status: AC
  Filled 2021-04-05: qty 20

## 2021-04-05 MED ORDER — CHLORHEXIDINE GLUCONATE CLOTH 2 % EX PADS
6.0000 | MEDICATED_PAD | Freq: Every day | CUTANEOUS | Status: DC
  Administered 2021-04-05 – 2021-04-08 (×4): 6 via TOPICAL

## 2021-04-05 MED ORDER — SODIUM CHLORIDE 0.9 % IV SOLN: INTRAVENOUS | Status: DC | PRN

## 2021-04-05 MED ORDER — CHLORHEXIDINE GLUCONATE 0.12 % MT SOLN
15.0000 mL | OROMUCOSAL | Status: AC
  Filled 2021-04-05: qty 15

## 2021-04-05 MED ORDER — POTASSIUM CHLORIDE IN NACL 20-0.9 MEQ/L-% IV SOLN
INTRAVENOUS | Status: DC
  Filled 2021-04-05 (×2): qty 1000

## 2021-04-05 MED ORDER — FENTANYL CITRATE (PF) 250 MCG/5ML IJ SOLN
INTRAMUSCULAR | Status: DC | PRN
  Administered 2021-04-05 (×2): 50 ug via INTRAVENOUS

## 2021-04-05 SURGICAL SUPPLY — 61 items
ADH SKN CLS APL DERMABOND .7 (GAUZE/BANDAGES/DRESSINGS) ×1
BAG COUNTER SPONGE SURGICOUNT (BAG) ×2 IMPLANT
BAG DECANTER FOR FLEXI CONT (MISCELLANEOUS) ×2 IMPLANT
BAG SPNG CNTER NS LX DISP (BAG) ×1
BLADE CLIPPER SURG (BLADE) IMPLANT
BLADE SURG 11 STRL SS (BLADE) ×2 IMPLANT
BNDG CMPR 75X41 PLY HI ABS (GAUZE/BANDAGES/DRESSINGS)
BNDG COHESIVE 4X5 TAN STRL (GAUZE/BANDAGES/DRESSINGS) IMPLANT
BNDG STRETCH 4X75 STRL LF (GAUZE/BANDAGES/DRESSINGS) IMPLANT
BUR ACORN 6.0 (BURR) ×2 IMPLANT
CANISTER SUCT 3000ML PPV (MISCELLANEOUS) ×2 IMPLANT
CARTRIDGE OIL MAESTRO DRILL (MISCELLANEOUS) ×1 IMPLANT
CLIP VESOCCLUDE MED 6/CT (CLIP) IMPLANT
DECANTER SPIKE VIAL GLASS SM (MISCELLANEOUS) ×2 IMPLANT
DERMABOND ADVANCED (GAUZE/BANDAGES/DRESSINGS) ×1
DERMABOND ADVANCED .7 DNX12 (GAUZE/BANDAGES/DRESSINGS) ×1 IMPLANT
DIFFUSER DRILL AIR PNEUMATIC (MISCELLANEOUS) ×2 IMPLANT
DRAIN CHANNEL 10M FLAT 3/4 FLT (DRAIN) ×1 IMPLANT
DRAPE NEUROLOGICAL W/INCISE (DRAPES) IMPLANT
DRAPE WARM FLUID 44X44 (DRAPES) ×2 IMPLANT
ELECT REM PT RETURN 9FT ADLT (ELECTROSURGICAL) ×2
ELECTRODE REM PT RTRN 9FT ADLT (ELECTROSURGICAL) ×1 IMPLANT
GAUZE 4X4 16PLY ~~LOC~~+RFID DBL (SPONGE) IMPLANT
GAUZE SPONGE 4X4 12PLY STRL (GAUZE/BANDAGES/DRESSINGS) ×1 IMPLANT
GLOVE EXAM NITRILE XL STR (GLOVE) IMPLANT
GLOVE SURG ENC MOIS LTX SZ6.5 (GLOVE) ×2 IMPLANT
GLOVE SURG LTX SZ9 (GLOVE) ×4 IMPLANT
GLOVE SURG UNDER POLY LF SZ6.5 (GLOVE) ×2 IMPLANT
GOWN STRL REUS W/ TWL LRG LVL3 (GOWN DISPOSABLE) ×1 IMPLANT
GOWN STRL REUS W/ TWL XL LVL3 (GOWN DISPOSABLE) ×1 IMPLANT
GOWN STRL REUS W/TWL 2XL LVL3 (GOWN DISPOSABLE) ×2 IMPLANT
GOWN STRL REUS W/TWL LRG LVL3 (GOWN DISPOSABLE) ×2
GOWN STRL REUS W/TWL XL LVL3 (GOWN DISPOSABLE) ×2
HEMOSTAT POWDER KIT SURGIFOAM (HEMOSTASIS) ×1 IMPLANT
HEMOSTAT SURGICEL 2X14 (HEMOSTASIS) IMPLANT
HOOK DURA (MISCELLANEOUS) ×2 IMPLANT
KIT BASIN OR (CUSTOM PROCEDURE TRAY) ×2 IMPLANT
KIT TURNOVER KIT B (KITS) ×2 IMPLANT
NDL HYPO 25X1 1.5 SAFETY (NEEDLE) ×1 IMPLANT
NEEDLE HYPO 25X1 1.5 SAFETY (NEEDLE) ×2 IMPLANT
NS IRRIG 1000ML POUR BTL (IV SOLUTION) ×2 IMPLANT
OIL CARTRIDGE MAESTRO DRILL (MISCELLANEOUS) ×2
PACK CRANIOTOMY CUSTOM (CUSTOM PROCEDURE TRAY) ×2 IMPLANT
PAD ARMBOARD 7.5X6 YLW CONV (MISCELLANEOUS) ×6 IMPLANT
PATTIES SURGICAL .25X.25 (GAUZE/BANDAGES/DRESSINGS) IMPLANT
PATTIES SURGICAL .5 X.5 (GAUZE/BANDAGES/DRESSINGS) IMPLANT
PATTIES SURGICAL .5 X3 (DISPOSABLE) IMPLANT
PATTIES SURGICAL 1X1 (DISPOSABLE) IMPLANT
PIN MAYFIELD SKULL DISP (PIN) IMPLANT
SPONGE NEURO XRAY DETECT 1X3 (DISPOSABLE) IMPLANT
SPONGE SURGIFOAM ABS GEL 100 (HEMOSTASIS) ×1 IMPLANT
STAPLER VISISTAT 35W (STAPLE) ×2 IMPLANT
STOCKINETTE 6  STRL (DRAPES) ×2
STOCKINETTE 6 STRL (DRAPES) IMPLANT
STOCKINETTE TUBULAR 6 INCH (GAUZE/BANDAGES/DRESSINGS) ×1 IMPLANT
SUT NURALON 4 0 TR CR/8 (SUTURE) ×4 IMPLANT
SUT VIC AB 2-0 CT1 18 (SUTURE) ×2 IMPLANT
TOWEL GREEN STERILE (TOWEL DISPOSABLE) ×2 IMPLANT
TOWEL GREEN STERILE FF (TOWEL DISPOSABLE) ×2 IMPLANT
TRAY FOLEY MTR SLVR 16FR STAT (SET/KITS/TRAYS/PACK) IMPLANT
WATER STERILE IRR 1000ML POUR (IV SOLUTION) ×2 IMPLANT

## 2021-04-05 NOTE — Anesthesia Procedure Notes (Addendum)
Procedure Name: Intubation Date/Time: 04/05/2021 12:03 PM Performed by: Erick Colace, CRNA Pre-anesthesia Checklist: Patient identified, Emergency Drugs available, Suction available and Patient being monitored Patient Re-evaluated:Patient Re-evaluated prior to induction Oxygen Delivery Method: Circle system utilized Preoxygenation: Pre-oxygenation with 100% oxygen Induction Type: IV induction Ventilation: Mask ventilation without difficulty Laryngoscope Size: Mac and 3 Grade View: Grade I Tube type: Oral Tube size: 7.0 mm Number of attempts: 1 Airway Equipment and Method: Stylet and Oral airway Placement Confirmation: ETT inserted through vocal cords under direct vision, positive ETCO2 and breath sounds checked- equal and bilateral Secured at: 21 cm Tube secured with: Tape Dental Injury: Teeth and Oropharynx as per pre-operative assessment

## 2021-04-05 NOTE — ED Notes (Signed)
Ambulated to BR and back to bed, denies nero symptoms-weakness/numbness/tingling. Reports HA +4 this am.

## 2021-04-05 NOTE — ED Notes (Signed)
Received verbal report from Kelly G RN at this time 

## 2021-04-05 NOTE — H&P (Signed)
Amy Cannon is an 75 y.o. female.   Chief Complaint: Headache HPI: 75 year old female approximately 3 weeks status post fall from horse.  Work-up demonstrated evidence of a acute right-sided subdural hematoma for which she was minimally symptomatic.  The patient was observed in the hospital and subsequent discharged home.  She is had worsening headaches and presents now to the ER with headache and some intermittent confusion.  She denies any seizure activity.  She has no numbness paresthesias or weakness.  She has had no new trauma.  Head CT scan demonstrates evidence of liquefication of her right-sided subdural hematoma with ongoing and somewhat worsening mass-effect upon the right hemisphere.  There is no new evidence of hemorrhage.  Past Medical History:  Diagnosis Date   Arthritis    Cancer ([place])    BASAL CELL SKIN CANCER   Essential hypertension 05/03/2014   GERD (gastroesophageal reflux disease)    History of hiatal hernia    LARGE   HTN (hypertension)    stress test 08-04-2007 EF 70%; low risk scan , no significant ischemia   Hyperlipemia    Murmur, cardiac    2d-echo on 09-08-2012 EF  55-60% normal study, mild LVH, no significant valve disease   Pneumonia     Past Surgical History:  Procedure Laterality Date   CATARACT EXTRACTION W/ INTRAOCULAR LENS IMPLANT Bilateral    COLONOSCOPY     COLONOSCOPY WITH PROPOFOL N/A 01/13/2020   Procedure: COLONOSCOPY WITH PROPOFOL;  Surgeon: Robert Bellow, MD;  Location: [facility];  Service: Endoscopy;  Laterality: N/A;   complex repair wrist/hand/finger     ESOPHAGOGASTRODUODENOSCOPY     ESOPHAGOGASTRODUODENOSCOPY N/A 11/03/2020   Procedure: ESOPHAGOGASTRODUODENOSCOPY (EGD);  Surgeon: [name], DO;  Location: [facility];  Service: Gastroenterology;  Laterality: N/A;   ESOPHAGOGASTRODUODENOSCOPY (EGD) WITH PROPOFOL N/A 01/13/2020   Procedure: ESOPHAGOGASTRODUODENOSCOPY (EGD) WITH PROPOFOL;  Surgeon: Robert Bellow, MD;   Location: [facility];  Service: Endoscopy;  Laterality: N/A;   HIATAL HERNIA REPAIR N/A 11/03/2020   Procedure: LAPAROSCOPIC REPAIR OF HIATAL HERNIA;  Surgeon: Greer Pickerel, MD;  Location: [facility];  Service: General;  Laterality: N/A;   LAPAROSCOPIC APPENDECTOMY N/A 04/01/2020   Procedure: APPENDECTOMY LAPAROSCOPIC;  Surgeon: Robert Bellow, MD;  Location: [facility];  Service: General;  Laterality: N/A;   MINOR IRRIGATION AND DEBRIDEMENT OF WOUND Left    TONSILLECTOMY     TRANSORAL INCISIONLESS FUNDOPLICATION N/A 4/62/7035   Procedure: TRANSORAL INCISIONLESS FUNDOPLICATION;  Surgeon: [name], DO;  Location: [facility];  Service: Gastroenterology;  Laterality: N/A;    Family History  Problem Relation Age of Onset   CAD Other    Cancer Mother        appendectomy and spreaded to colon   Colon cancer Mother    Esophageal cancer Neg Hx    Rectal cancer Neg Hx    Stomach cancer Neg Hx    Social History:  reports that she has quit smoking. She has never used smokeless tobacco. She reports current alcohol use of about 5.0 standard drinks per week. She reports that she does not use drugs.  Allergies:  Allergies  Allergen Reactions   Lisinopril Cough    (Not in a hospital admission)   Results for orders placed or performed during the hospital encounter of 04/04/21 (from the past 48 hour(s))  Comprehensive metabolic panel     Status: Abnormal   Collection Time: 04/04/21 10:22 PM  Result Value Ref Range   Sodium 139 135 -  145 mmol/L   Potassium 4.2 3.5 - 5.1 mmol/L   Chloride 111 98 - 111 mmol/L   CO2 22 22 - 32 mmol/L   Glucose, Bld 97 70 - 99 mg/dL    Comment: Glucose reference range applies only to samples taken after fasting for at least 8 hours.   BUN 13 8 - 23 mg/dL   Creatinine, Ser 0.68 0.44 - 1.00 mg/dL   Calcium 9.5 8.9 - 10.3 mg/dL   Total Protein 5.8 (L) 6.5 - 8.1 g/dL   Albumin 3.3 (L) 3.5 - 5.0 g/dL   AST 15 15 - 41 U/L   ALT 8 0 - 44 U/L   Alkaline  Phosphatase 72 38 - 126 U/L   Total Bilirubin 0.7 0.3 - 1.2 mg/dL   GFR, Estimated >60 >60 mL/min    Comment: (NOTE) Calculated using the CKD-EPI Creatinine Equation (2021)    Anion gap 6 5 - 15    Comment: Performed at [facility], [place] [address], [place], [place] [address]  CBC     Status: None   Collection Time: 04/04/21 10:22 PM  Result Value Ref Range   WBC 7.7 4.0 - 10.5 K/uL   RBC 4.37 3.87 - 5.11 MIL/uL   Hemoglobin 13.2 12.0 - 15.0 g/dL   HCT 41.2 36.0 - 46.0 %   MCV 94.3 80.0 - 100.0 fL   MCH 30.2 26.0 - 34.0 pg   MCHC 32.0 30.0 - 36.0 g/dL   RDW 12.7 11.5 - 15.5 %   Platelets 324 150 - 400 K/uL   nRBC 0.0 0.0 - 0.2 %    Comment: Performed at [facility], [place] [address] [place], [place] [address]  Resp Panel by RT-PCR (Flu A&B, Covid) Nasopharyngeal Swab     Status: None   Collection Time: 04/04/21 11:56 PM   Specimen: Nasopharyngeal Swab; Nasopharyngeal(NP) swabs in vial transport medium  Result Value Ref Range   SARS Coronavirus 2 by RT PCR NEGATIVE NEGATIVE    Comment: (NOTE) SARS-CoV-2 target nucleic acids are NOT DETECTED.  The SARS-CoV-2 RNA is generally detectable in upper respiratory specimens during the acute phase of infection. The lowest concentration of SARS-CoV-2 viral copies this assay can detect is 138 copies/mL. A negative result does not preclude SARS-Cov-2 infection and should not be used as the sole basis for treatment or other patient management decisions. A negative result may occur with  improper specimen collection/handling, submission of specimen other than nasopharyngeal swab, presence of viral mutation(s) within the areas targeted by this assay, and inadequate number of viral copies(<138 copies/mL). A negative result must be combined with clinical observations, patient history, and epidemiological information. The expected result is Negative.  Fact Sheet for Patients:  EntrepreneurPulse.com.au  Fact  Sheet for Healthcare Providers:  IncredibleEmployment.be  This test is no t yet approved or cleared by the [place] FDA and  has been authorized for detection and/or diagnosis of SARS-CoV-2 by FDA under an Emergency Use Authorization (EUA). This EUA will remain  in effect (meaning this test can be used) for the duration of the COVID-19 declaration under Section 564(b)(1) of the Act, 21 U.S.C.section 360bbb-3(b)(1), unless the authorization is terminated  or revoked sooner.       Influenza A by PCR NEGATIVE NEGATIVE   Influenza B by PCR NEGATIVE NEGATIVE    Comment: (NOTE) The Xpert Xpress SARS-CoV-2/FLU/RSV plus assay is intended as an aid in the diagnosis of influenza from Nasopharyngeal swab specimens and should  not be used as a sole basis for treatment. Nasal washings and aspirates are unacceptable for Xpert Xpress SARS-CoV-2/FLU/RSV testing.  Fact Sheet for Patients: EntrepreneurPulse.com.au  Fact Sheet for Healthcare Providers: IncredibleEmployment.be  This test is not yet approved or cleared by the [place] FDA and has been authorized for detection and/or diagnosis of SARS-CoV-2 by FDA under an Emergency Use Authorization (EUA). This EUA will remain in effect (meaning this test can be used) for the duration of the COVID-19 declaration under Section 564(b)(1) of the Act, 21 U.S.C. section 360bbb-3(b)(1), unless the authorization is terminated or revoked.  Performed at [facility], [place] [address], [place], [place] [address]   Urinalysis, Routine w reflex microscopic Urine, Clean Catch     Status: Abnormal   Collection Time: 04/05/21  2:37 AM  Result Value Ref Range   Color, Urine STRAW (A) YELLOW   APPearance CLEAR CLEAR   Specific Gravity, Urine 1.006 1.005 - 1.030   pH 6.0 5.0 - 8.0   Glucose, UA NEGATIVE NEGATIVE mg/dL   Hgb urine dipstick NEGATIVE NEGATIVE   Bilirubin Urine NEGATIVE NEGATIVE    Ketones, ur NEGATIVE NEGATIVE mg/dL   Protein, ur NEGATIVE NEGATIVE mg/dL   Nitrite NEGATIVE NEGATIVE   Leukocytes,Ua TRACE (A) NEGATIVE   RBC / HPF 0-5 0 - 5 RBC/hpf   WBC, UA 6-10 0 - 5 WBC/hpf   Bacteria, UA NONE SEEN NONE SEEN   Squamous Epithelial / LPF 0-5 0 - 5   Mucus PRESENT    Non Squamous Epithelial 0-5 (A) NONE SEEN    Comment: Performed at [facility], [place] [address] [place], [place] [address]   CT Head Wo Contrast  Result Date: 04/04/2021 CLINICAL DATA:  Increased confusion and short-term memory loss after recent admission for subdural hematoma after fall from horse. EXAM: CT HEAD WITHOUT CONTRAST TECHNIQUE: Contiguous axial images were obtained from the base of the skull through the vertex without intravenous contrast. COMPARISON:  CT 03/22/2021 FINDINGS: Brain: Right cerebral convexity subdural hematoma measures 14 mm, previously 10 mm. Previous acute blood products have primarily resolved and are now chronic blood products, with minimal hyperdensity persisting in the anterior frontal region. There is 9 mm right to left shift, previously 7 mm. There is similar mass effect on the right lateral ventricle. Slight increased size of the left lateral ventricle, frontal horn measuring 12 mm, previously 9 mm, occipital measuring 16 mm previously 15 mm. No new hemorrhage. No intraparenchymal, intraventricular, or subarachnoid blood. Vascular: Atherosclerosis of skullbase vasculature without hyperdense vessel or abnormal calcification. Skull: Skull fracture.  No acute findings. Sinuses/Orbits: Bilateral cataract resection no acute findings. Other: None. IMPRESSION: 1. Right cerebral convexity subdural hematoma measures 14 mm, previously 10 mm. Previous acute blood products have primarily resolved and are now chronic blood products. Minimal residual or potentially acute hyperdense component. Increased 9 mm right to left shift, previously 7 mm. 2. Mass effect on the right lateral  ventricle and cerebral hemisphere. Slight increased size of the left lateral ventricle. 3. No new hemorrhage. These results were called by telephone at the time of interpretation on 04/04/2021 at 10:14 pm to provider Regenia Skeeter, who verbally acknowledged these results. Electronically Signed   By: Keith Rake M.D.   On: 04/04/2021 22:17    Pertinent items noted in HPI and remainder of comprehensive ROS otherwise negative.  Blood pressure 133/69, pulse (!) 56, temperature 97.9 F (36.6 C), temperature source Oral, resp. rate 15, height 5\' 5"  (1.651 m),  weight 64.9 kg, SpO2 96 %.  Patient is awake and alert.  She is oriented and appropriate.  Speech is fluent.  Judgment insight are intact.  Cranial nerve function normal bilaterally except for a chronic left-sided resting facial droop.  Motor examination 5/5 bilaterally.  No pronator drift.  Chest and abdomen benign.  Extremities are free of major deformity. 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  Discussed situation with the patient.  Recommended we proceed with right-sided bur hole evacuation of subdural hematoma in hopes of improving her symptoms.  I discussed the risks involved with surgery including but not limited to risk of anesthesia, bleeding, infection, brain injury including stroke coma death or seizure.  The patient is aware that she has a significant risk of hemorrhage recurrence and may need further surgery.  The patient understands these risks and wishes to proceed  Charlie Pitter 04/05/2021, 9:01 AM

## 2021-04-05 NOTE — Anesthesia Preprocedure Evaluation (Addendum)
Anesthesia Evaluation  Patient identified by MRN, date of birth, ID band Patient awake    Reviewed: Allergy & Precautions, NPO status , Patient's Chart, lab work & pertinent test results  Airway Mallampati: III  TM Distance: >3 FB Neck ROM: Full    Dental no notable dental hx. (+) Teeth Intact, Dental Advisory Given   Pulmonary former smoker,    Pulmonary exam normal breath sounds clear to auscultation       Cardiovascular hypertension, Pt. on medications Normal cardiovascular exam+ Valvular Problems/Murmurs (mild MR) MR  Rhythm:Regular Rate:Normal  Echo 2014: - Left ventricle: The cavity size was normal. Wall thickness  was increased in a pattern of mild LVH. There was mild  concentric hypertrophy. Systolic function was normal. The  estimated ejection fraction was in the range of 55% to  60%. Wall motion was normal; there were no regional wall  motion abnormalities. Left ventricular diastolic function  parameters were normal.  - Ventricular septum: Thickness was mildly increased.  - Mitral valve: Mild regurgitation.  - Left atrium: The atrium was mildly dilated.  - Atrial septum: No defect or patent foramen ovale was  identified.     Neuro/Psych SDH    3 weeks status post fall from horse.  Work-up demonstrated evidence of a acute right-sided subdural hematoma for which she was minimally symptomatic.  The patient was observed in the hospital and subsequent discharged home.  She is had worsening headaches and presents now to the ER with headache and some intermittent confusion.     Head CT scan demonstrates evidence of liquefication of her right-sided subdural hematoma with ongoing and somewhat worsening mass-effect upon the right hemisphere.  There is no new evidence of hemorrhage. negative psych ROS   GI/Hepatic Neg liver ROS, hiatal hernia (s/p repair in june), GERD  Controlled,  Endo/Other  negative  endocrine ROS  Renal/GU negative Renal ROS  negative genitourinary   Musculoskeletal  (+) Arthritis , Osteoarthritis,    Abdominal   Peds  Hematology negative hematology ROS (+) hct 41.2   Anesthesia Other Findings   Reproductive/Obstetrics negative OB ROS                            Anesthesia Physical Anesthesia Plan  ASA: 3  Anesthesia Plan: General   Post-op Pain Management:    Induction: Intravenous  PONV Risk Score and Plan: 3 and Ondansetron, Dexamethasone, Midazolam and Treatment may vary due to age or medical condition  Airway Management Planned: Oral ETT  Additional Equipment: None  Intra-op Plan:   Post-operative Plan: Extubation in OR  Informed Consent: I have reviewed the patients History and Physical, chart, labs and discussed the procedure including the risks, benefits and alternatives for the proposed anesthesia with the patient or authorized representative who has indicated his/her understanding and acceptance.     Dental advisory given  Plan Discussed with: CRNA  Anesthesia Plan Comments:        Anesthesia Quick Evaluation

## 2021-04-05 NOTE — Op Note (Signed)
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  Asst. Surgeon: Reinaldo Meeker, NP  Anesthesia: General  Indication: 75 year old female approximately 3 weeks status post fall from a horse.  Patient with acute subdural diagnosed at that time.  Patient was minimally symptomatic.  The subdural was followed over time.  The subdural is liquefied but she is having increasing headaches and some intermittent confusion.  Work-up demonstrates evidence of liquefication of her subdural hematoma but ongoing severe mass-effect.  Patient presents now for bur hole evacuation.  Operative note: After induction anesthesia, patient positioned supine with her head turned toward the left.  Patient's right frontal and parietal scalp prepped and draped sterilely.  Incision was made in the right frontal and right parietal region.  This carried down sharply to the pericranium.  Retractors were placed.  Bur holes were made.  Dura was coagulated.  Dura was then incised in the the dural edges were burned back to the edges of the bur holes themselves.  Chronic subdural hematoma was allowed to drain under pressure.  This was irrigated clear.  There is no evidence of any active hemorrhage.  The brain reexpanded well.  A 10 mm flat Blake drain was then passed from the parietal hole passed the frontal hole under direct visualization.  This was exited through separate stab incision.  Wounds were irrigated 1 final time.  Hemostasis was good.  Scalp was reapproximated with 2-0 Vicryl at the galea and staples at the surface.  The drain was secured in place and hooked to a bulb suction device.  There were no apparent complications.  Patient tolerated the procedure  well and she returns to recovery room postop.

## 2021-04-05 NOTE — ED Notes (Signed)
Ambulating to restroom without assistance

## 2021-04-05 NOTE — ED Notes (Signed)
Moved to room at this time

## 2021-04-05 NOTE — Progress Notes (Signed)
OT Cancellation Note  Patient Details Name: Amy Cannon MRN: [id number] DOB: March 04, 1946   Cancelled Treatment:    Reason Eval/Treat Not Completed: Other (comment). Pt scheduled to have surgery today. Will follow up at a later time.   Bianka Liberati,HILLARY 04/05/2021, 9:59 AM Maurie Boettcher, OT/L   Acute OT Clinical Specialist Acute Rehabilitation Services Pager [phone] Office [phone]

## 2021-04-05 NOTE — Anesthesia Postprocedure Evaluation (Signed)
Anesthesia Post Note  Patient: Amy Cannon  Procedure(s) Performed: Right sided BURR HOLE evacuation of subdural hematoma (Right)     Patient location during evaluation: PACU Anesthesia Type: General Level of consciousness: awake and alert, oriented and patient cooperative Pain management: pain level controlled Vital Signs Assessment: post-procedure vital signs reviewed and stable Respiratory status: spontaneous breathing, nonlabored ventilation and respiratory function stable Cardiovascular status: blood pressure returned to baseline and stable Postop Assessment: no apparent nausea or vomiting Anesthetic complications: no   No notable events documented.  Last Vitals:  Vitals:   04/05/21 1315 04/05/21 1330  BP: 138/76 132/79  Pulse: 71 69  Resp: 13 20  Temp:  36.8 C  SpO2: 97% 95%    Last Pain:  Vitals:   04/05/21 1342  TempSrc:   PainSc: [place]

## 2021-04-05 NOTE — Evaluation (Signed)
Physical Therapy Evaluation Patient Details Name: Amy Cannon MRN: [id number] DOB: 04-Dec-1945 Today's Date: 04/05/2021  History of Present Illness  Pt is a 75 y/o female admitted 11/15 secondary to worsening confusion. Pt with recent admission secondary to R SDH after falling off a horse. Plan is for OR on 11/16 per notes. PMH includes HTN, and skin cancer.  Clinical Impression  Pt admitted secondary to problem above with deficits below. Pt requiring min guard to supervision for safety for mobility tasks. Pt requiring safety cues throughout. Reports she was having issues with her neck and recommending outpatient PT once appropriate. Per pt, likely to OR on 11/16, so will follow up after to ensure there are no further d/c needs. Will continue to follow acutely.        Recommendations for follow up therapy are one component of a multi-disciplinary discharge planning process, led by the attending physician.  Recommendations may be updated based on patient status, additional functional criteria and insurance authorization.  Follow Up Recommendations Outpatient PT    Assistance Recommended at Discharge Intermittent Supervision/Assistance  Functional Status Assessment Patient has had a recent decline in their functional status and demonstrates the ability to make significant improvements in function in a reasonable and predictable amount of time.  Equipment Recommendations  None recommended by PT    Recommendations for Other Services       Precautions / Restrictions Precautions Precautions: Fall Restrictions Weight Bearing Restrictions: No      Mobility  Bed Mobility Overal bed mobility: Modified Independent                  Transfers Overall transfer level: Needs assistance Equipment used: None Transfers: Sit to/from Stand Sit to Stand: Min guard           General transfer comment: Min guard for safety. Safety cues to wait for PT to get IV ready     Ambulation/Gait Ambulation/Gait assistance: Min guard;Supervision Gait Distance (Feet): 75 Feet Assistive device: None Gait Pattern/deviations: Step-through pattern;Decreased stride length Gait velocity: Decreased     General Gait Details: Overall steady gait. Safety cues to wait for PT to follow with IV pole. Min guard to supervision for safety.  Stairs            Wheelchair Mobility    Modified Rankin (Stroke Patients Only)       Balance Overall balance assessment: Needs assistance Sitting-balance support: No upper extremity supported;Feet supported Sitting balance-Leahy Scale: Good     Standing balance support: No upper extremity supported;During functional activity Standing balance-Leahy Scale: Fair                               Pertinent Vitals/Pain Pain Assessment: Faces Faces Pain Scale: Hurts little more Pain Location: headache Pain Descriptors / Indicators: Headache Pain Intervention(s): Limited activity within patient's tolerance;Monitored during session;Repositioned    Home Living Family/patient expects to be discharged to:: Private residence Living Arrangements: Alone Available Help at Discharge: Friend(s);Available PRN/intermittently Type of Home: House Home Access: Stairs to enter Entrance Stairs-Rails: None Entrance Stairs-Number of Steps: 2   Home Layout: Two level;Able to live on main level with bedroom/bathroom Home Equipment: None      Prior Function Prior Level of Function : Independent/Modified Independent                     Hand Dominance        Extremity/Trunk Assessment  Upper Extremity Assessment Upper Extremity Assessment: Defer to OT evaluation    Lower Extremity Assessment Lower Extremity Assessment: Overall WFL for tasks assessed    Cervical / Trunk Assessment Cervical / Trunk Assessment: Normal  Communication   Communication: No difficulties  Cognition Arousal/Alertness:  Awake/alert Behavior During Therapy: Anxious Overall Cognitive Status: No family/caregiver present to determine baseline cognitive functioning                                 General Comments: Slightly anxious and very anxious about surgery. Easily distracted throughout        General Comments      Exercises     Assessment/Plan    PT Assessment Patient needs continued PT services  PT Problem List Decreased mobility;Decreased cognition;Decreased activity tolerance;Decreased safety awareness;Decreased knowledge of precautions       PT Treatment Interventions DME instruction;Gait training;Stair training;Functional mobility training;Therapeutic activities;Balance training;Therapeutic exercise;Patient/family education    PT Goals (Current goals can be found in the Care Plan section)  Acute Rehab PT Goals Patient Stated Goal: to go home PT Goal Formulation: With patient Time For Goal Achievement: 04/19/21 Potential to Achieve Goals: Good    Frequency Min 3X/week   Barriers to discharge        Co-evaluation               AM-PAC PT "6 Clicks" Mobility  Outcome Measure Help needed turning from your back to your side while in a flat bed without using bedrails?: None Help needed moving from lying on your back to sitting on the side of a flat bed without using bedrails?: None Help needed moving to and from a bed to a chair (including a wheelchair)?: A Little Help needed standing up from a chair using your arms (e.g., wheelchair or bedside chair)?: A Little Help needed to walk in hospital room?: A Little Help needed climbing 3-5 steps with a railing? : A Little 6 Click Score: 20    End of Session   Activity Tolerance: Patient tolerated treatment well Patient left: in bed;with call bell/phone within reach (on stretcher in ED) Nurse Communication: Mobility status PT Visit Diagnosis: Other abnormalities of gait and mobility (R26.89);Other symptoms and signs  involving the nervous system (E33.295)    Time: 0902-0920 PT Time Calculation (min) (ACUTE ONLY): 18 min   Charges:   PT Evaluation $PT Eval Low Complexity: 1 Low          Lou Miner, DPT  Acute Rehabilitation Services  Pager: [phone] Office: [phone]   Rudean Hitt 04/05/2021, 10:08 AM

## 2021-04-05 NOTE — Transfer of Care (Signed)
Immediate Anesthesia Transfer of Care Note  Patient: Amy Cannon  Procedure(s) Performed: Right sided BURR HOLE evacuation of subdural hematoma (Right)  Patient Location: PACU  Anesthesia Type:General  Level of Consciousness: awake and patient cooperative  Airway & Oxygen Therapy: Patient Spontanous Breathing and Patient connected to face mask oxygen  Post-op Assessment: Report given to RN and Post -op Vital signs reviewed and stable  Post vital signs: Reviewed and stable  Last Vitals:  Vitals Value Taken Time  BP 142/74 04/05/21 1256  Temp    Pulse 77 04/05/21 1258  Resp 21 04/05/21 1258  SpO2 100 % 04/05/21 1258  Vitals shown include unvalidated device data.  Last Pain:  Vitals:   04/05/21 1100  TempSrc: Oral  PainSc:          Complications: No notable events documented.

## 2021-04-05 NOTE — Brief Op Note (Signed)
04/04/2021 - 04/05/2021  12:40 PM  PATIENT:  Amy Cannon  75 y.o. female  PRE-OPERATIVE DIAGNOSIS:  sdh  POST-OPERATIVE DIAGNOSIS:  sdh  PROCEDURE:  Procedure(s): Right sided BURR HOLE evacuation of subdural hematoma (Right)  SURGEON:  Surgeon(s) and Role:    Earnie Larsson, MD - Primary  PHYSICIAN ASSISTANT:   ASSISTANTSMearl Latin   ANESTHESIA:   general  EBL:  Minimal   BLOOD ADMINISTERED:none  DRAINS: (10 mm) Blake drain(s) in the Subdural space    LOCAL MEDICATIONS USED:  LIDOCAINE   SPECIMEN:  No Specimen  DISPOSITION OF SPECIMEN:  N/A  COUNTS:  YES  TOURNIQUET:  * No tourniquets in log *  DICTATION: .Dragon Dictation  PLAN OF CARE: Admit to inpatient   PATIENT DISPOSITION:  PACU - hemodynamically stable.   Delay start of Pharmacological VTE agent (>24hrs) due to surgical blood loss or risk of bleeding: yes

## 2021-04-05 NOTE — TOC CAGE-AID Note (Signed)
Transition of Care [facility]) - CAGE-AID Screening   Patient Details  Name: Amy Cannon MRN: [id number] Date of Birth: 1946-05-21  Transition of [facility]) CM/SW Contact:    Gaetano Hawthorne Tarpley-Carter, LCSWA Phone Number: 04/05/2021, 1:55 PM   Clinical Narrative: Pt participated in [place].  Pt stated she does not use substance or ETOH.  Pt was not offered resources, due to no usage of substance or ETOH.     Esti Demello Tarpley-Carter, MSW, LCSW-A Pronouns:  She/Her/Hers [facility] Clinical Social Worker Direct Number:  [phone] [email or web address]  CAGE-AID Screening:    Have You Ever Felt You Ought to Cut Down on Your Drinking or Drug Use?: No Have People Annoyed You By [employer] Your Drinking Or Drug Use?: No Have You Felt Bad Or Guilty About Your Drinking Or Drug Use?: No Have You Ever Had a Drink or Used Drugs First Thing In The Morning to Steady Your Nerves or to Get Rid of a Hangover?: No CAGE-AID Score: 0  Substance Abuse Education Offered: No

## 2021-04-05 NOTE — Progress Notes (Signed)
SLP Cancellation Note  Patient Details Name: Amy Cannon MRN: [id number] DOB: [birth year]   Cancelled treatment:       Reason Eval/Treat Not Completed: Other (comment). Will defer until f/u after procedure   Emileo Semel, Katherene Ponto 04/05/2021, 10:43 AM

## 2021-04-06 ENCOUNTER — Encounter (HOSPITAL_COMMUNITY): Payer: Self-pay | Admitting: Neurosurgery

## 2021-04-06 ENCOUNTER — Inpatient Hospital Stay (HOSPITAL_COMMUNITY): Payer: Medicare Other

## 2021-04-06 MED ORDER — CIPROFLOXACIN HCL 0.3 % OP SOLN: 2.0000 [drp] | OPHTHALMIC | Status: DC

## 2021-04-06 MED ORDER — ERYTHROMYCIN 5 MG/GM OP OINT
TOPICAL_OINTMENT | Freq: Three times a day (TID) | OPHTHALMIC | Status: DC
  Administered 2021-04-06 – 2021-04-07 (×2): 1 via OPHTHALMIC
  Filled 2021-04-06 (×2): qty 3.5

## 2021-04-06 MED ORDER — LEVETIRACETAM 500 MG PO TABS
500.0000 mg | ORAL_TABLET | Freq: Two times a day (BID) | ORAL | Status: DC
  Administered 2021-04-06 – 2021-04-08 (×5): 500 mg via ORAL
  Filled 2021-04-06 (×5): qty 1

## 2021-04-06 MED ORDER — PANTOPRAZOLE SODIUM 40 MG PO TBEC
40.0000 mg | DELAYED_RELEASE_TABLET | Freq: Every day | ORAL | Status: DC
  Administered 2021-04-06 – 2021-04-08 (×3): 40 mg via ORAL
  Filled 2021-04-06 (×3): qty 1

## 2021-04-06 NOTE — Progress Notes (Signed)
Providing Compassionate, Quality Care - Together   Subjective: Patient reports right eye pain and watering overnight. No complaints of headache, nausea, weakness, or photophobia.  Objective: Vital signs in last 24 hours: Temp:  [97.9 F (36.6 C)-99.1 F (37.3 C)] 98.6 F (37 C) (11/17 0400) Pulse Rate:  [54-78] 59 (11/17 0911) Resp:  [11-32] 14 (11/17 0911) BP: (97-162)/(60-95) 114/71 (11/17 0911) SpO2:  [87 %-99 %] 93 % (11/17 0911)  Intake/Output from previous day: 11/16 0701 - 11/17 0700 In: 2424 [I.V.:2023.5; IV Piggyback:400.4] Out: 145 [Drains:135; Blood:10] Intake/Output this shift: Total I/O In: 498.7 [P.O.:480; I.V.:18.7] Out: -   Mental status: alert and oriented x 4 PERRLA Right eye red and tearing Speech clear, fluent Motor Exam - grossly normal Sensory Exam - grossly normal Speech clear Cranial Nerves: I: smell Not tested  II: visual acuity OS: Normal  OD: Normal  II: visual fields Full to confrontation  II: pupils Equal, round, reactive to light  III,VII: ptosis None  III,IV,VI: extraocular muscles Full ROM  V: mastication Normal  V: facial light touch sensation Normal  V,VII: corneal reflex Present  VII: facial muscle function - upper Normal  VII: facial muscle function - lower Normal  VIII: hearing Not tested  IX: soft palate elevation Normal  IX,X: gag reflex Present  XI: trapezius strength 5/5  XI: sternocleidomastoid strength 5/5  XI: neck flexion strength 5/5  XII: tongue strength Normal      Lab Results: Recent Labs    04/04/21 2222  WBC 7.7  HGB 13.2  HCT 41.2  PLT 324   BMET Recent Labs    04/04/21 2222  NA 139  K 4.2  CL 111  CO2 22  GLUCOSE 97  BUN 13  CREATININE 0.68  CALCIUM 9.5    Studies/Results: CT HEAD WO CONTRAST  Result Date: 04/06/2021 CLINICAL DATA:  75 year old female postoperative day 1 status post right side subdural evacuation, drain placement. EXAM: CT HEAD WITHOUT CONTRAST TECHNIQUE:  Contiguous axial images were obtained from the base of the skull through the vertex without intravenous contrast. COMPARISON:  Head CT 04/04/2021 and earlier. FINDINGS: Brain: Right side subdural drain now in place. Only trace pneumocephalus. Drainage of the predominantly low-density right side subdural hematoma, with a small volume 4-5 mm thick residual. Intracranial mass effect is decreased. Leftward midline shift now 6-7 mm (previously 9 to 10 mm). Improved patency of the right lateral ventricle. Stable to slightly decreased trapping of the left lateral ventricle. No definite transependymal edema now. Improved suprasellar cistern. Other basilar cisterns are normal. No new intracranial hemorrhage. Stable gray-white matter differentiation throughout the brain. No cortically based acute infarct identified. Vascular: Calcified atherosclerosis at the skull base. No suspicious intracranial vascular hyperdensity. Skull: Right side burr hole placement x2. Subdural drain enters through the more superior burr hole. Incidental occipital bone arachnoid granulations, normal variant. Sinuses/Orbits: Visualized paranasal sinuses and mastoids are stable and well aerated. Other: Postoperative changes to the right scalp. Skin staples in place. Negative visible orbits. IMPRESSION: 1. Right side subdural drain placed with decreased subdural hematoma (now 4-5 mm) and decreased intracranial mass effect, leftward midline shift (now 6-7 mm). Improved but not yet normalized appearance of the lateral ventricles. 2. No new intracranial abnormality. Electronically Signed   By: Genevie Ann M.D.   On: 04/06/2021 08:52   CT Head Wo Contrast  Result Date: 04/04/2021 CLINICAL DATA:  Increased confusion and short-term memory loss after recent admission for subdural hematoma after fall from horse. EXAM:  CT HEAD WITHOUT CONTRAST TECHNIQUE: Contiguous axial images were obtained from the base of the skull through the vertex without intravenous  contrast. COMPARISON:  CT 03/22/2021 FINDINGS: Brain: Right cerebral convexity subdural hematoma measures 14 mm, previously 10 mm. Previous acute blood products have primarily resolved and are now chronic blood products, with minimal hyperdensity persisting in the anterior frontal region. There is 9 mm right to left shift, previously 7 mm. There is similar mass effect on the right lateral ventricle. Slight increased size of the left lateral ventricle, frontal horn measuring 12 mm, previously 9 mm, occipital measuring 16 mm previously 15 mm. No new hemorrhage. No intraparenchymal, intraventricular, or subarachnoid blood. Vascular: Atherosclerosis of skullbase vasculature without hyperdense vessel or abnormal calcification. Skull: Skull fracture.  No acute findings. Sinuses/Orbits: Bilateral cataract resection no acute findings. Other: None. IMPRESSION: 1. Right cerebral convexity subdural hematoma measures 14 mm, previously 10 mm. Previous acute blood products have primarily resolved and are now chronic blood products. Minimal residual or potentially acute hyperdense component. Increased 9 mm right to left shift, previously 7 mm. 2. Mass effect on the right lateral ventricle and cerebral hemisphere. Slight increased size of the left lateral ventricle. 3. No new hemorrhage. These results were called by telephone at the time of interpretation on 04/04/2021 at 10:14 pm to provider Regenia Skeeter, who verbally acknowledged these results. Electronically Signed   By: Keith Rake M.D.   On: 04/04/2021 22:17    Assessment/Plan: Patient underwent right-sided burr holes for evacuation of her right-sided SDH by Dr. Annette Stable on 04/06/2021. Follow up imaging shows improvement, with reduction in mass effect and midline shift.   LOS: 1 day   -Will leave subdural drain in place for one more day -Mobilize patient as tolerated -Erythromycin ophthalmic ointment ordered for right eye. Keep eye closed as much as possible.   Viona Gilmore, DNP, AGNP-C Nurse Practitioner  [facility] [place] [address], [address], [place], [place] [address] P: [phone]    F: [phone]  04/06/2021, 11:29 AM

## 2021-04-06 NOTE — Evaluation (Signed)
Occupational Therapy Evaluation Patient Details Name: Amy Cannon MRN: [id number] DOB: 09-Mar-1946 Today's Date: 04/06/2021   History of Present Illness Pt is a 75 y/o female admitted 11/15 secondary to worsening confusion. Pt with recent admission secondary to R SDH after falling off a horse. S/p R burr hole for SDH evacuation on 11/16. PMH includes HTN, and skin cancer.   Clinical Impression   PTA [place]. Pt presented with confusion. Pt assessed with the Medi-cog Medication Transfer Screen and received a score of 3/5, demonstrating difficulty with alternating attention, reasoning and recognition of errors. Pt omitted 11/32 letters on letter cancellation task and able to recall 3/4 tasks on 4 step activity. Also made several errors on sequential subtraction task and tangential at times. Decreased awareness of deficits and the impact these deficits may have on IADL tasks, I.e. cooking, medication and financial management and driving.  At this time recommend direct S with all medication and financial management and to refrain from driving. Will follow acutely and further assess executive level cognitive skills with The PillBox Test. At this time recommend follow up with OT at the neuro outpt center.      Recommendations for follow up therapy are one component of a multi-disciplinary discharge planning process, led by the attending physician.  Recommendations may be updated based on patient status, additional functional criteria and insurance authorization.   Follow Up Recommendations  Outpatient OT (neuro outpt)    Assistance Recommended at Discharge Intermittent Supervision/Assistance (refrain from driving)  Functional Status Assessment  Patient has had a recent decline in their functional status and demonstrates the ability to make significant improvements in function in a reasonable and predictable amount of time.  Equipment Recommendations  None recommended by OT     Recommendations for Other Services       Precautions / Restrictions Precautions Precautions: Fall Precaution Comments: subdural drain      Mobility Bed Mobility Overal bed mobility: Modified Independent                  Transfers Overall transfer level: Modified independent Equipment used: None Transfers: Sit to/from Stand Sit to Stand: Min guard           General transfer comment: Min guard for safety. Safety cues to wait for PT to get IV ready      Balance Overall balance assessment: Mild deficits observed, not formally tested Sitting-balance support: No upper extremity supported;Feet supported Sitting balance-Leahy Scale: Good     Standing balance support: No upper extremity supported;During functional activity Standing balance-Leahy Scale: Fair                             ADL either performed or assessed with clinical judgement   ADL                                               Vision Patient Visual Report: No change from baseline Additional Comments: cataract surgery adn does not wear glasses; appears intact     Perception Perception Comments: pt with occasional incorrect responses during double simultaneous stimulation   Praxis      Pertinent Vitals/Pain Pain Assessment: Faces Faces Pain Scale: Hurts little more Pain Location: R eye with opening Pain Descriptors / Indicators: Burning;Sharp Pain Intervention(s): Limited activity within patient's tolerance  Hand Dominance Right   Extremity/Trunk Assessment Upper Extremity Assessment Upper Extremity Assessment: Overall WFL for tasks assessed       Cervical / Trunk Assessment Cervical / Trunk Assessment: Normal   Communication Communication Communication: No difficulties   Cognition Arousal/Alertness: Awake/alert Behavior During Therapy: WFL for tasks assessed/performed Overall Cognitive Status: Impaired/Different from baseline Area of  Impairment: Attention;Memory;Safety/judgement;Awareness;Problem solving                               General Comments: Assessed with the Medi-cog, demonstrating 2/5 errors. Note difficulty with shifting attention, returning to task once distracted and recognizing errors. Decreased awareness of deficits and the impact on IADL tasks. Pt also demonstrated difficulty with sequential subtraction, losing her place x 3 during the sequence. Pt omitted 11/32 letters on cancellation tasks.     General Comments  jp drain; firend present during assessment    Exercises     Shoulder Instructions      Home Living Family/patient expects to be discharged to:: Private residence Living Arrangements: Alone Available Help at Discharge: Friend(s);Available PRN/intermittently Type of Home: House Home Access: Stairs to enter [employer] of Steps: 2 Entrance Stairs-Rails: None Home Layout: Two level;Able to live on main level with bedroom/bathroom     Bathroom Shower/Tub: Occupational psychologist: Handicapped height     Home Equipment: None      Lives With: Alone    Prior Functioning/Environment Prior Level of Function : Independent/Modified Independent;Driving;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;rides horses)                        OT Problem List: Decreased safety awareness;Pain;Impaired vision/perception;Decreased cognition      OT Treatment/Interventions: Self-care/ADL training;DME and/or AE instruction;Energy conservation;Therapeutic activities;Cognitive remediation/compensation;Visual/perceptual remediation/compensation;Patient/family education;Balance training    OT Goals(Current goals can be found in the care plan section) Acute Rehab OT Goals Patient Stated Goal: to go home to her farm OT Goal Formulation: With patient Time For Goal Achievement: 04/20/21 Potential to Achieve Goals: Good  OT Frequency: Min 2X/week   Barriers to D/C:             Co-evaluation              AM-PAC OT "6 Clicks" Daily Activity     Outcome Measure Help from another person eating meals?: None Help from another person taking care of personal grooming?: A Little Help from another person toileting, which includes using toliet, bedpan, or urinal?: A Little Help from another person bathing (including washing, rinsing, drying)?: A Little Help from another person to put on and taking off regular upper body clothing?: A Little Help from another person to put on and taking off regular lower body clothing?: A Little 6 Click Score: 19   End of Session Nurse Communication: Mobility status;Other (comment) (performance of cognitive assessment)  Activity Tolerance: Patient tolerated treatment well Patient left: in bed;with call bell/phone within reach;with family/visitor present;with bed alarm set  OT Visit Diagnosis: Other symptoms and signs involving the nervous system (R29.898);Other symptoms and signs involving cognitive function;Pain Pain - part of body:  (ye)                Time: 1540-0867 OT Time Calculation (min): 31 min Charges:  OT General Charges $OT Visit: 1 Visit OT Evaluation $OT Eval Moderate Complexity: 1 Mod OT Treatments $Therapeutic Activity: 8-22 mins  [facility], OT/L  Acute OT Clinical Specialist Acute Rehabilitation Services Pager [phone] Office [phone]   [facility] 04/06/2021, 4:10 PM

## 2021-04-06 NOTE — Progress Notes (Signed)
Physical Therapy Treatment & Discharge Patient Details Name: Amy Cannon MRN: [id number] DOB: 06/22/1945 Today's Date: 04/06/2021   History of Present Illness Pt is a 75 y/o female admitted 11/15 secondary to worsening confusion. Pt with recent admission secondary to R SDH after falling off a horse. S/p R burr hole for SDH evacuation on 11/16. PMH includes HTN, and skin cancer.    PT Comments    Patient functioning at [place] level for mobility. Patient feels at baseline. Patient able to perform quick turns and picking up objects from floor with no LOB. Patient negotiated stairs with ease. No further skilled PT needs required acutely. Recommend OPPT for continued strengthening and improving endurance. PT will sign off.     Recommendations for follow up therapy are one component of a multi-disciplinary discharge planning process, led by the attending physician.  Recommendations may be updated based on patient status, additional functional criteria and insurance authorization.  Follow Up Recommendations  Outpatient PT     Assistance Recommended at Discharge PRN  Equipment Recommendations  None recommended by PT    Recommendations for Other Services       Precautions / Restrictions Precautions Precautions: Fall Precaution Comments: subdural drain Restrictions Weight Bearing Restrictions: No     Mobility  Bed Mobility Overal bed mobility: Modified Independent                  Transfers Overall transfer level: Modified independent Equipment used: None                    Ambulation/Gait Ambulation/Gait assistance: Modified independent (Device/Increase time) Gait Distance (Feet): 250 Feet Assistive device: None Gait Pattern/deviations: WFL(Within Functional Limits)   Gait velocity interpretation: >4.37 ft/sec, indicative of normal walking speed   General Gait Details: steady gait with no LOB with quick turns   Stairs Stairs: Yes Stairs assistance:  Modified independent (Device/Increase time) Stair Management: No rails;Alternating pattern;Forwards Number of Stairs: 2     Wheelchair Mobility    Modified Rankin (Stroke Patients Only)       Balance Overall balance assessment: Mild deficits observed, not formally tested                                          Cognition Arousal/Alertness: Awake/alert Behavior During Therapy: WFL for tasks assessed/performed Overall Cognitive Status: Within Functional Limits for tasks assessed                                   Functional Status Assessment: Patient has not had a recent decline in their functional status      Exercises      General Comments        Pertinent Vitals/Pain Pain Assessment: Faces Pain Score: 8  Faces Pain Scale: Hurts little more Pain Location: R eye with opening Pain Descriptors / Indicators: Burning;Sharp Pain Intervention(s): Monitored during session;Repositioned    Home Living                          Prior Function            PT Goals (current goals can now be found in the care plan section) Acute Rehab PT Goals Patient Stated Goal: to go home PT Goal Formulation: All assessment and education complete, DC therapy  Progress towards PT goals: Goals met/education completed, patient discharged from PT    Frequency    Min 3X/week      PT Plan Current plan remains appropriate    Co-evaluation              AM-PAC PT "6 Clicks" Mobility   Outcome Measure  Help needed turning from your back to your side while in a flat bed without using bedrails?: None Help needed moving from lying on your back to sitting on the side of a flat bed without using bedrails?: None Help needed moving to and from a bed to a chair (including a wheelchair)?: None Help needed standing up from a chair using your arms (e.g., wheelchair or bedside chair)?: None Help needed to walk in hospital room?: None Help needed  climbing 3-5 steps with a railing? : None 6 Click Score: 24    End of Session   Activity Tolerance: Patient tolerated treatment well Patient left: in bed;with call bell/phone within reach Nurse Communication: Mobility status PT Visit Diagnosis: Other abnormalities of gait and mobility (R26.89);Other symptoms and signs involving the nervous system (R29.898)     Time: 0929-5747 PT Time Calculation (min) (ACUTE ONLY): 17 min  Charges:  $Gait Training: 8-22 mins                     Pravin Perezperez A. Gilford Rile PT, DPT Acute Rehabilitation Services Pager [phone] Office [phone]    Linna Hoff 04/06/2021, 11:01 AM

## 2021-04-06 NOTE — Evaluation (Signed)
Speech Language Pathology Evaluation Patient Details Name: Shaquinta Peruski MRN: [id number] DOB: [birth year] Today's Date: 04/06/2021 Time: 0930-1000 SLP Time Calculation (min) (ACUTE ONLY): 30 min  Problem List:  Patient Active Problem List   Diagnosis Date Noted   Subdural hematoma 04/04/2021   SDH (subdural hematoma) 03/14/2021   History of fundoplication    Hiatal hernia with GERD and esophagitis 11/03/2020   Gastroesophageal reflux disease with esophagitis without hemorrhage    Hiatal hernia    Essential hypertension 05/03/2014   Past Medical History:  Past Medical History:  Diagnosis Date   Arthritis    Cancer ([place])    BASAL CELL SKIN CANCER   Essential hypertension 05/03/2014   GERD (gastroesophageal reflux disease)    History of hiatal hernia    LARGE   HTN (hypertension)    stress test 08-04-2007 EF 70%; low risk scan , no significant ischemia   Hyperlipemia    Murmur, cardiac    2d-echo on 09-08-2012 EF  55-60% normal study, mild LVH, no significant valve disease   Pneumonia    Past Surgical History:  Past Surgical History:  Procedure Laterality Date   BURR HOLE Right 04/05/2021   Procedure: Right sided BURR HOLE evacuation of subdural hematoma;  Surgeon: Earnie Larsson, MD;  Location: [place];  Service: Neurosurgery;  Laterality: Right;   CATARACT EXTRACTION W/ INTRAOCULAR LENS IMPLANT Bilateral    COLONOSCOPY     COLONOSCOPY WITH PROPOFOL N/A 01/13/2020   Procedure: COLONOSCOPY WITH PROPOFOL;  Surgeon: Robert Bellow, MD;  Location: [facility];  Service: Endoscopy;  Laterality: N/A;   complex repair wrist/hand/finger     ESOPHAGOGASTRODUODENOSCOPY     ESOPHAGOGASTRODUODENOSCOPY N/A 11/03/2020   Procedure: ESOPHAGOGASTRODUODENOSCOPY (EGD);  Surgeon: [name], DO;  [facility];  Service: Gastroenterology;  Laterality: N/A;   ESOPHAGOGASTRODUODENOSCOPY (EGD) WITH PROPOFOL N/A 01/13/2020   Procedure: ESOPHAGOGASTRODUODENOSCOPY (EGD) WITH PROPOFOL;   Surgeon: Robert Bellow, MD;  Location: [facility];  Service: Endoscopy;  Laterality: N/A;   HIATAL HERNIA REPAIR N/A 11/03/2020   Procedure: LAPAROSCOPIC REPAIR OF HIATAL HERNIA;  Surgeon: Greer Pickerel, MD;  Location: [facility];  Service: General;  Laterality: N/A;   LAPAROSCOPIC APPENDECTOMY N/A 04/01/2020   Procedure: APPENDECTOMY LAPAROSCOPIC;  Surgeon: Robert Bellow, MD;  Location: [facility];  Service: General;  Laterality: N/A;   MINOR IRRIGATION AND DEBRIDEMENT OF WOUND Left    TONSILLECTOMY     TRANSORAL INCISIONLESS FUNDOPLICATION N/A 0/86/5784   Procedure: TRANSORAL INCISIONLESS FUNDOPLICATION;  Surgeon: [name], DO;  [facility];  Service: Gastroenterology;  Laterality: N/A;   HPI:  75yo female admitted 04/05/21 with headache and confusion, s/p fall from horse 3 weeks prior. Acute right SDH identified at that time. Pt minimally symptomatic. 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 PMH: arthritis, skin cancer, essential HTN, GERD, large hiatal hernia, HTN, HLD, heart murmer, PNA. Burr hole 04/05/21   Assessment / Plan / Recommendation Clinical Impression  Pt was seen at bedside for cognitive linguistic evaluation. CN exam unremarkable. Speech is fully intelligible. Receptive and Expressive language are intact. The [place] Mental Status (SLUMS) Examination was administered to assess cognition. Pt scored 27/30 indicating performance within functional limits. Points were lost on delayed recall (recalled 4/5 words) and digit reversal. Pt was encouraged to notify her physician if difficulties arise upon return to normal routines. Prior to admit, pt was fully independent. No further ST intervention recommended at this time. Please  reconsult if needs arise.    SLP Assessment  SLP Recommendation/Assessment: Patient does not need any further Speech Language Pathology Services  SLP Visit Diagnosis:  Cognitive communication deficit (R41.841)    Recommendations for follow up therapy are one component of a multi-disciplinary discharge planning process, led by the attending physician.  Recommendations may be updated based on patient status, additional functional criteria and insurance authorization.    Follow Up Recommendations  Other (comment) (outpatient services if needs arise.)    Assistance Recommended at Discharge  None  Functional Status Assessment Patient has not had a recent decline in their functional status        SLP Evaluation Cognition  Overall Cognitive Status: Within Functional Limits for tasks assessed Arousal/Alertness: Awake/alert Orientation Level: Oriented X4       Comprehension  Auditory Comprehension Overall Auditory Comprehension: Appears within functional limits for tasks assessed Visual Recognition/Discrimination Discrimination: Within Function Limits Reading Comprehension Reading Status: Within funtional limits    Expression Expression Primary Mode of Expression: Verbal Verbal Expression Overall Verbal Expression: Appears within functional limits for tasks assessed Written Expression Dominant Hand: Right Written Expression: Within Functional Limits   Oral / Motor  Oral Motor/Sensory Function Overall Oral Motor/Sensory Function: Within functional limits Motor Speech Overall Motor Speech: Appears within functional limits for tasks assessed Intelligibility: Intelligible   GO                   Yashika Mask B. Quentin Ore, [facility], [place] Speech Language Pathologist Office: [phone]  Shonna Chock 04/06/2021, 10:12 AM

## 2021-04-07 MED ORDER — POLYETHYLENE GLYCOL 3350 17 G PO PACK
17.0000 g | PACK | Freq: Every day | ORAL | Status: DC
  Administered 2021-04-07 – 2021-04-08 (×2): 17 g via ORAL
  Filled 2021-04-07 (×2): qty 1

## 2021-04-07 NOTE — Progress Notes (Signed)
1155 Pt transferred to room 4N07 via wheelchair with cell phone, charger, book, bag of clothing and shoes, and toiletries. At time of transfer pt was A&Ox4, neuro intact, NAD, no complaints.

## 2021-04-07 NOTE — Progress Notes (Signed)
Postop day 2.  Patient patient doing very well.  No headache.  No neurologic symptoms.  Eye pain from corneal abrasion has resolved.  Vision normal bilaterally.  Vital signs are stable.  She is afebrile.  Drain output is low.  She is awake and alert.  She is oriented and appropriate.  Speech is fluent.  Judgment insight are intact.  Cranial nerve function normal bilateral.  Motor and sensory examination extremities normal.  Wounds clean and dry.  Drain removed today.  Overall progressing well.  Mobilize further today.  If stable tomorrow may be discharged home.

## 2021-04-08 NOTE — Discharge Summary (Signed)
Physician Discharge Summary  Patient ID: Amy Cannon MRN: [id number] DOB/AGE: 1946-03-02 75 y.o.  Admit date: 04/04/2021 Discharge date: 04/08/2021  Admission Diagnoses: Right subdural hematoma, chronic.  Subfalcine herniation  Discharge Diagnoses: Right subdural hematoma.  Subfalcine herniation. Active Problems:   SDH (subdural hematoma)   Subdural hematoma   Discharged Condition: good  Hospital Course: Patient was admitted to undergo surgical decompression of a chronic subdural hematoma with subfalcine herniation.  She tolerated surgery well.  Consults: None  Significant Diagnostic Studies: None  Treatments: surgery: See op note  Discharge Exam: Blood pressure (!) 146/95, pulse 62, temperature 98 F (36.7 C), temperature source Oral, resp. rate 20, height 5\' 5"  (1.651 m), weight 65.8 kg, SpO2 93 %. Incision is clean and dry Station and gait are intact.  Disposition: Discharge disposition: 01-Home or Self Care       Discharge Instructions     Call MD for:  redness, tenderness, or signs of infection (pain, swelling, redness, odor or green/yellow discharge around incision site)   Complete by: As directed    Call MD for:  severe uncontrolled pain   Complete by: As directed    Call MD for:  temperature >100.4   Complete by: As directed    Diet - low sodium heart healthy   Complete by: As directed    Discharge wound care:   Complete by: As directed    Do not shampoo scalp until staples removed on Tuesday   Increase activity slowly   Complete by: As directed       Allergies as of 04/08/2021       Reactions   Lisinopril Cough        Medication List     TAKE these medications    Alpha-Lipoic Acid 600 MG Caps Take 600 mg by mouth every evening.   B-12 PO Take 1,000 mcg by mouth daily.   COQ-10 PO Take 300 mg by mouth daily.   ibuprofen 200 MG tablet Commonly known as: ADVIL Take 200-400 mg by mouth every 6 (six) hours as needed for headache or  moderate pain.   irbesartan-hydrochlorothiazide 300-12.5 MG tablet Commonly known as: AVALIDE Take 1 tablet by mouth daily. Please schedule appointment for refills. What changed:  when to take this additional instructions   Magnesium 400 MG Caps Take 400 mg by mouth 2 (two) times daily.   multivitamin with minerals Tabs tablet Take 1 tablet by mouth daily.   Olive Leaf Extract 250 MG Caps Take 250 mg by mouth daily.   OVER THE COUNTER MEDICATION Take 1 tablet by mouth daily. Bone Collagenizer otc supplement Vitamin C supplement   raloxifene 60 MG tablet Commonly known as: EVISTA Take 60 mg by mouth every evening.   REFRESH TEARS OP Place 1 drop into both eyes daily as needed (dry eyes).   RESVERATROL PO Take 1 tablet by mouth daily. Vitamin supplement   simethicone 80 MG chewable tablet Commonly known as: MYLICON Chew 1 tablet (80 mg total) by mouth every 6 (six) hours as needed for flatulence (gas, bloating, abdominal discomfort).   vitamin A 25000 UNIT capsule Take 25,000 Units by mouth every Monday, Wednesday, and Friday.   vitamin C 500 MG tablet Commonly known as: ASCORBIC ACID Take 500 mg by mouth daily.   Vitamin D3 10 MCG (400 UNIT) Caps Take 400 Units by mouth daily.   VITAMIN-B COMPLEX PO Take 1 tablet by mouth daily.  Discharge Care Instructions  (From admission, onward)           Start     Ordered   04/08/21 0000  Discharge wound care:       Comments: Do not shampoo scalp until staples removed on Tuesday   04/08/21 1147             Signed: Blanchie Dessert Rayane Gallardo 04/08/2021, 11:47 AM

## 2021-04-17 ENCOUNTER — Ambulatory Visit (HOSPITAL_COMMUNITY): Payer: Medicare Other

## 2021-04-19 ENCOUNTER — Ambulatory Visit (HOSPITAL_COMMUNITY): Payer: Medicare Other

## 2021-05-04 ENCOUNTER — Ambulatory Visit (HOSPITAL_COMMUNITY)
Admission: RE | Admit: 2021-05-04 | Discharge: 2021-05-04 | Disposition: A | Discharge status: Home / Self Care | Payer: Medicare Other | Source: Ambulatory Visit | Attending: Neurosurgery | Admitting: Neurosurgery

## 2021-05-04 ENCOUNTER — Other Ambulatory Visit: Payer: Self-pay

## 2021-05-04 DIAGNOSIS — S065XAA Traumatic subdural hemorrhage with loss of consciousness status unknown, initial encounter: Secondary | ICD-10-CM | POA: Diagnosis not present

## 2021-05-05 ENCOUNTER — Encounter (HOSPITAL_COMMUNITY): Payer: Self-pay

## 2021-05-05 ENCOUNTER — Other Ambulatory Visit (HOSPITAL_COMMUNITY): Payer: Medicare Other

## 2021-07-24 ENCOUNTER — Other Ambulatory Visit (HOSPITAL_COMMUNITY): Payer: Self-pay | Admitting: Student

## 2021-07-24 ENCOUNTER — Other Ambulatory Visit: Payer: Self-pay | Admitting: Student

## 2021-07-24 DIAGNOSIS — S065XAA Traumatic subdural hemorrhage with loss of consciousness status unknown, initial encounter: Secondary | ICD-10-CM

## 2021-07-27 ENCOUNTER — Other Ambulatory Visit: Payer: Self-pay

## 2021-07-27 ENCOUNTER — Ambulatory Visit (HOSPITAL_COMMUNITY)
Admission: RE | Admit: 2021-07-27 | Discharge: 2021-07-27 | Disposition: A | Discharge status: Home / Self Care | Payer: Medicare Other | Source: Ambulatory Visit | Attending: Student | Admitting: Student

## 2021-07-27 DIAGNOSIS — S065XAA Traumatic subdural hemorrhage with loss of consciousness status unknown, initial encounter: Secondary | ICD-10-CM | POA: Diagnosis present

## 2021-09-06 ENCOUNTER — Telehealth: Payer: Self-pay | Admitting: Cardiovascular Disease

## 2021-09-06 DIAGNOSIS — E7800 Pure hypercholesterolemia, unspecified: Secondary | ICD-10-CM

## 2021-09-06 DIAGNOSIS — I10 Essential (primary) hypertension: Secondary | ICD-10-CM

## 2021-09-06 NOTE — Telephone Encounter (Signed)
?  Pt returning call, she said she also wanted to add LPA for her lab test ?

## 2021-09-06 NOTE — Telephone Encounter (Signed)
Left message on pt's voicemail.  ? ?Pt would like to have lab orders placed to do lab prior to her upcoming office visit on 5/11. Will forward to Dr. Sallyanne Kuster for advisement. ?

## 2021-09-06 NOTE — Telephone Encounter (Signed)
Patient is requesting lab orders be placed for her to have them performed prior to her upcoming appointment on 05/11. She requested a LPA be added to the orders and she be contacted once they are placed so she knows she can come in.  ?

## 2021-09-07 NOTE — Telephone Encounter (Signed)
Left a message for the patient that fasting lab orders have been placed.  ? ? ?

## 2021-09-18 ENCOUNTER — Encounter: Payer: Self-pay | Admitting: Cardiovascular Disease

## 2021-09-20 LAB — BASIC METABOLIC PANEL
BUN/Creatinine Ratio: 13 (ref 12–28)
BUN: 11 mg/dL (ref 8–27)
CO2: 26 mmol/L (ref 20–29)
Calcium: 10.6 mg/dL — ABNORMAL HIGH (ref 8.7–10.3)
Chloride: 104 mmol/L (ref 96–106)
Creatinine, Ser: 0.82 mg/dL (ref 0.57–1.00)
Glucose: 94 mg/dL (ref 70–99)
Potassium: 4.4 mmol/L (ref 3.5–5.2)
Sodium: 140 mmol/L (ref 134–144)
eGFR: 75 mL/min/{1.73_m2} (ref >59)

## 2021-09-20 LAB — LIPID PANEL
Chol/HDL Ratio: 5 ratio — ABNORMAL HIGH (ref 0.0–4.4)
Cholesterol, Total: 307 mg/dL — ABNORMAL HIGH (ref 100–199)
HDL: 61 mg/dL (ref >39)
LDL Chol Calc (NIH): 204 mg/dL — ABNORMAL HIGH (ref 0–99)
Triglycerides: 216 mg/dL — ABNORMAL HIGH (ref 0–149)
VLDL Cholesterol Cal: 42 mg/dL — ABNORMAL HIGH (ref 5–40)

## 2021-09-20 LAB — LIPOPROTEIN A (LPA): Lipoprotein (a): 370.2 nmol/L — ABNORMAL HIGH (ref <75.0)

## 2021-09-28 ENCOUNTER — Encounter: Payer: Self-pay | Admitting: Cardiovascular Disease

## 2021-09-28 ENCOUNTER — Ambulatory Visit (INDEPENDENT_AMBULATORY_CARE_PROVIDER_SITE_OTHER): Payer: Medicare Other | Admitting: Cardiovascular Disease

## 2021-09-28 VITALS — BP 124/82 | HR 64 | Ht 65.5 in | Wt 148.8 lb

## 2021-09-28 DIAGNOSIS — K449 Diaphragmatic hernia without obstruction or gangrene: Secondary | ICD-10-CM

## 2021-09-28 DIAGNOSIS — I10 Essential (primary) hypertension: Secondary | ICD-10-CM

## 2021-09-28 DIAGNOSIS — E7800 Pure hypercholesterolemia, unspecified: Secondary | ICD-10-CM | POA: Diagnosis not present

## 2021-09-28 DIAGNOSIS — K2100 Gastro-esophageal reflux disease with esophagitis, without bleeding: Secondary | ICD-10-CM

## 2021-09-28 NOTE — Patient Instructions (Signed)
Medication Instructions:  ?No changes ?*If you need a refill on your cardiac medications before your next appointment, please call your pharmacy* ? ? ?Lab Work: ?None ordered ?If you have labs (blood work) drawn today and your tests are completely normal, you will receive your results only by: ?MyChart Message (if you have MyChart) OR ?A paper copy in the mail ?If you have any lab test that is abnormal or we need to change your treatment, we will call you to review the results. ? ? ?Testing/Procedures: ?Dr. Sallyanne Kuster has ordered a CT coronary calcium score.  ? ?Test locations:  ?[facility] ([address] [place], [place] [address]) ?[facility] ([address] [place], [place] [address])  ? ?This is $99 out of pocket. ? ? ?Coronary CalciumScan ?A coronary calcium scan is an imaging test used to look for deposits of calcium and other fatty materials (plaques) in the inner lining of the blood vessels of the heart (coronary arteries). These deposits of calcium and plaques can partly clog and narrow the coronary arteries without producing any symptoms or warning signs. This puts a person at risk for a heart attack. This test can detect these deposits before symptoms develop. ?Tell a health care provider about: ?Any allergies you have. ?All medicines you are taking, including vitamins, herbs, eye drops, creams, and over-the-counter medicines. ?Any problems you or family members have had with anesthetic medicines. ?Any blood disorders you have. ?Any surgeries you have had. ?Any medical conditions you have. ?Whether you are pregnant or may be pregnant. ?What are the risks? ?Generally, this is a safe procedure. However, problems may occur, including: ?Harm to a pregnant woman and her unborn baby. This test involves the use of radiation. Radiation exposure can be dangerous to a pregnant woman and her unborn baby. If you are pregnant, you generally should not have this procedure done. ?Slight increase in  the risk of cancer. This is because of the radiation involved in the test. ?What happens before the procedure? ?No preparation is needed for this procedure. ?What happens during the procedure? ?You will undress and remove any jewelry around your neck or chest. ?You will put on a hospital gown. ?Sticky electrodes will be placed on your chest. The electrodes will be connected to an electrocardiogram (ECG) machine to record a tracing of the electrical activity of your heart. ?A CT scanner will take pictures of your heart. During this time, you will be asked to lie still and hold your breath for 2-3 seconds while a picture of your heart is being taken. ?The procedure may vary among health care providers and hospitals. ?What happens after the procedure? ?You can get dressed. ?You can return to your normal activities. ?It is up to you to get the results of your test. Ask your health care provider, or the department that is doing the test, when your results will be ready. ?Summary ?A coronary calcium scan is an imaging test used to look for deposits of calcium and other fatty materials (plaques) in the inner lining of the blood vessels of the heart (coronary arteries). ?Generally, this is a safe procedure. Tell your health care provider if you are pregnant or may be pregnant. ?No preparation is needed for this procedure. ?A CT scanner will take pictures of your heart. ?You can return to your normal activities after the scan is done. ?This information is not intended to replace advice given to you by your health care provider. Make sure you discuss any questions you have  with your health care provider. ?Document Released: 11/03/2007 Document Revised: 03/26/2016 Document Reviewed: 03/26/2016 ?Elsevier Interactive Patient Education ? 2017 Elsevier Inc. ? ? ? ?Follow-Up: ?At [facility], you and your health needs are our priority.  As part of our continuing mission to provide you with exceptional heart care, we have created  designated Provider Care Teams.  These Care Teams include your primary Cardiologist (physician) and Advanced Practice Providers (APPs -  Physician Assistants and Nurse Practitioners) who all work together to provide you with the care you need, when you need it. ? ?We recommend signing up for the patient portal called "MyChart".  Sign up information is provided on this After Visit Summary.  MyChart is used to connect with patients for Virtual Visits (Telemedicine).  Patients are able to view lab/test results, encounter notes, upcoming appointments, etc.  Non-urgent messages can be sent to your provider as well.   ?To learn more about what you can do with MyChart, go to [email or web address].   ? ?Your next appointment:   ?12 month(s) ? ?The format for your next appointment:   ?In Person ? ?Provider:   ?Sanda Klein, MD { ? ? ? ?

## 2021-09-28 NOTE — Progress Notes (Signed)
?Cardiology Office Note:   ? ?Date:  09/28/2021  ? ?ID:  Amy Cannon, DOB 29-Jan-1946, MRN [id number] ? ?PCP:  Asencion Noble, MD ?  ?[place]  ?Cardiologist:  Sanda Klein, MD Hiba Garry ?Advanced Practice Provider:  No care team member to display ?Electrophysiologist:  None  ?    ? ?Referring MD: Asencion Noble, MD  ? ?Chief Complaint  ?Patient presents with  ? Hyperlipidemia  ? ? ?History of Present Illness:   ? ?Amy Cannon is a 76 y.o. female with a hx of hypertension, hypercholesterolemia, large hiatal hernia with GERD, reported history of cardiac murmur without significant valvular abnormalities on echocardiography, returning for routine follow-up. ? ?She has had a difficult year due to noncardiac issues.  She underwent hiatal hernia surgery in the summer and then had a delayed subdural hematoma after a horse riding accident for which she underwent surgical drainage in December. ? ?She has had a fairly slow physical recovery and still feels the condition.  She started riding horses again, but gets tired much easier.  She finds that her muscles are weaker.  She is planning to start a sports medicine program. ? ?She has been eating a lot of dairy products (drinking milk, eating cheese, etc.) in an effort to improve her bone calcification. ? ?Her cholesterol is higher than ever and her LDL is over 200.  Her HDL has decreased substantially, although it is still in very good range. ? ?The hiatal hernia surgery seems to have helped, she does not require H2 blockers or proton pump inhibitors anymore.  She is happy because she was worried that her osteopenia was related to the PPI therapy. ? ?Past Medical History:  ?Diagnosis Date  ? Arthritis   ? Cancer [facility])   ? BASAL CELL SKIN CANCER  ? Essential hypertension 05/03/2014  ? GERD (gastroesophageal reflux disease)   ? History of hiatal hernia   ? LARGE  ? HTN (hypertension)   ? stress test 08-04-2007 EF 70%; low risk scan , no significant ischemia  ?  Hyperlipemia   ? Murmur, cardiac   ? 2d-echo on 09-08-2012 EF  55-60% normal study, mild LVH, no significant valve disease  ? Pneumonia   ? ? ?Past Surgical History:  ?Procedure Laterality Date  ? BURR HOLE Right 04/05/2021  ? Procedure: Right sided BURR HOLE evacuation of subdural hematoma;  Surgeon: Earnie Larsson, MD;  Location: [place];  Service: Neurosurgery;  Laterality: Right;  ? CATARACT EXTRACTION W/ INTRAOCULAR LENS IMPLANT Bilateral   ? COLONOSCOPY    ? COLONOSCOPY WITH PROPOFOL N/A 01/13/2020  ? Procedure: COLONOSCOPY WITH PROPOFOL;  Surgeon: Robert Bellow, MD;  Location: [facility];  Service: Endoscopy;  Laterality: N/A;  ? complex repair wrist/hand/finger    ? ESOPHAGOGASTRODUODENOSCOPY    ? ESOPHAGOGASTRODUODENOSCOPY N/A 11/03/2020  ? Procedure: ESOPHAGOGASTRODUODENOSCOPY (EGD);  Surgeon: [name], DO;  [facility];  Service: Gastroenterology;  Laterality: N/A;  ? ESOPHAGOGASTRODUODENOSCOPY (EGD) WITH PROPOFOL N/A 01/13/2020  ? Procedure: ESOPHAGOGASTRODUODENOSCOPY (EGD) WITH PROPOFOL;  Surgeon: Robert Bellow, MD;  Location: [facility];  Service: Endoscopy;  Laterality: N/A;  ? HIATAL HERNIA REPAIR N/A 11/03/2020  ? Procedure: LAPAROSCOPIC REPAIR OF HIATAL HERNIA;  Surgeon: Greer Pickerel, MD;  [facility];  Service: General;  Laterality: N/A;  ? LAPAROSCOPIC APPENDECTOMY N/A 04/01/2020  ? Procedure: APPENDECTOMY LAPAROSCOPIC;  Surgeon: Robert Bellow, MD;  Location: [facility];  Service: General;  Laterality: N/A;  ? MINOR IRRIGATION AND DEBRIDEMENT OF WOUND  Left   ? TONSILLECTOMY    ? TRANSORAL INCISIONLESS FUNDOPLICATION N/A 7/82/4235  ? Procedure: TRANSORAL INCISIONLESS FUNDOPLICATION;  Surgeon: [name], DO;  [facility];  Service: Gastroenterology;  Laterality: N/A;  ? ? ?Current Medications: ?Current Meds  ?Medication Sig  ? Alpha-Lipoic Acid 600 MG CAPS Take 600 mg by mouth every evening.  ? B Complex Vitamins (VITAMIN-B COMPLEX PO) Take 1 tablet by  mouth daily.   ? Beta Carotene (VITAMIN A) 25000 UNIT capsule Take 25,000 Units by mouth every Monday, Wednesday, and Friday.  ? Biotin 1 MG CAPS Take 1 mg by mouth daily in the afternoon.  ? Carboxymethylcellulose Sodium (REFRESH TEARS OP) Place 1 drop into both eyes daily as needed (dry eyes).  ? Cholecalciferol (VITAMIN D3) 10 MCG (400 UNIT) CAPS Take 400 Units by mouth daily.  ? Coenzyme Q10 (COQ-10 PO) Take 300 mg by mouth daily.  ? Cyanocobalamin (B-12 PO) Take 1,000 mcg by mouth daily.  ? famotidine (PEPCID) 40 MG tablet Take 40 mg by mouth daily.  ? irbesartan-hydrochlorothiazide (AVALIDE) 300-12.5 MG tablet Take 1 tablet by mouth daily. Please schedule appointment for refills. (Patient taking differently: Take 1 tablet by mouth at bedtime.)  ? Magnesium 400 MG CAPS Take 400 mg by mouth 2 (two) times daily.  ? Multiple Vitamin (MULTIVITAMIN WITH MINERALS) TABS tablet Take 1 tablet by mouth daily.  ? Olive Leaf Extract 250 MG CAPS Take 250 mg by mouth daily.  ? raloxifene (EVISTA) 60 MG tablet Take 60 mg by mouth every evening.   ? RESVERATROL PO Take 1 tablet by mouth daily. Vitamin supplement  ? scopolamine (TRANSDERM-SCOP) 1 MG/3DAYS Place onto the skin.  ? simethicone (MYLICON) 80 MG chewable tablet Chew 1 tablet (80 mg total) by mouth every 6 (six) hours as needed for flatulence (gas, bloating, abdominal discomfort).  ? vitamin C (ASCORBIC ACID) 500 MG tablet Take 500 mg by mouth daily.  ? vitamin E 180 MG (400 UNITS) capsule Take 400 Units by mouth daily.  ?  ? ?Allergies:   Lisinopril  ? ?Social History  ? ?Socioeconomic History  ? Marital status: Widowed  ?  Spouse name: Not on file  ? Number of children: Not on file  ? Years of education: Not on file  ? Highest education level: Not on file  ?Occupational History  ? Not on file  ?Tobacco Use  ? Smoking status: Former  ? Smokeless tobacco: Never  ?Vaping Use  ? Vaping Use: Never used  ?Substance and Sexual Activity  ? Alcohol use: Yes  ?  Alcohol/week:  5.0 standard drinks  ?  Types: 5 Standard drinks or equivalent per week  ?  Comment: SOCIALLY  ? Drug use: No  ? Sexual activity: Not on file  ?Other Topics Concern  ? Not on file  ?Social History Narrative  ? Not on file  ? ?Social Determinants of Health  ? ?Financial Resource Strain: Not on file  ?Food Insecurity: Not on file  ?Transportation Needs: Not on file  ?Physical Activity: Not on file  ?Stress: Not on file  ?Social Connections: Not on file  ?  ? ?Family History: ?The patient's family history includes CAD in an other family member; Cancer in her mother; Colon cancer in her mother. There is no history of Esophageal cancer, Rectal cancer, or Stomach cancer. ? ?ROS:   ?Please see the history of present illness.    ? All other systems reviewed and are negative. ? ?EKGs/Labs/Other Studies  Reviewed:   ? ?The following studies were reviewed today: ? ? ?EKG:  EKG is ordered today.  Shows normal sinus rhythm, low voltage in the lateral precordial leads probably due to soft tissue attenuation, otherwise normal tracing, no ischemic changes. ? ?Recent Labs: ?04/04/2021: ALT 8; Hemoglobin 13.2; Platelets 324 ?09/19/2021: BUN 11; Creatinine, Ser 0.82; Potassium 4.4; Sodium 140  ?Recent Lipid Panel ?   ?Component Value Date/Time  ? CHOL 307 (H) 09/19/2021 0947  ? TRIG 216 (H) 09/19/2021 0947  ? HDL 61 09/19/2021 0947  ? CHOLHDL 5.0 (H) 09/19/2021 0947  ? CHOLHDL 2.2 02/16/2013 1014  ? VLDL 15 02/16/2013 1014  ? [place] 204 (H) 09/19/2021 0947  ? ?07/28/2019 ?HDL 77, total cholesterol 220, triglycerides 124 ? ?Risk Assessment/Calculations:   ?  ? ? ?Physical Exam:   ? ?VS:  BP 124/82 (BP Location: Left Arm, Patient Position: Sitting, Cuff Size: Normal)   Pulse 64   Ht 5' 5.5" (1.664 m)   Wt 148 lb 12.8 oz (67.5 kg)   SpO2 96%   BMI 24.39 kg/m?    ? ?Wt Readings from Last 3 Encounters:  ?09/28/21 148 lb 12.8 oz (67.5 kg)  ?04/05/21 145 lb (65.8 kg)  ?11/17/20 141 lb 4 oz (64.1 kg)  ?  ? ?General: Alert, oriented x3, no  distress ?Head: no evidence of trauma, PERRL, EOMI, no exophtalmos or lid lag, no myxedema, no xanthelasma; normal ears, nose and oropharynx ?Neck: normal jugular venous pulsations and no hepatojugular r

## 2021-11-10 ENCOUNTER — Ambulatory Visit (HOSPITAL_BASED_OUTPATIENT_CLINIC_OR_DEPARTMENT_OTHER)
Admission: RE | Admit: 2021-11-10 | Discharge: 2021-11-10 | Disposition: A | Discharge status: Home / Self Care | Payer: Medicare Other | Source: Ambulatory Visit | Attending: Cardiovascular Disease | Admitting: Cardiovascular Disease

## 2021-11-10 DIAGNOSIS — E7800 Pure hypercholesterolemia, unspecified: Secondary | ICD-10-CM | POA: Insufficient documentation

## 2021-11-16 ENCOUNTER — Encounter: Payer: Self-pay | Admitting: Cardiovascular Disease

## 2021-11-16 ENCOUNTER — Ambulatory Visit (INDEPENDENT_AMBULATORY_CARE_PROVIDER_SITE_OTHER): Payer: Medicare Other | Admitting: Cardiovascular Disease

## 2021-11-16 VITALS — BP 130/80 | HR 74 | Ht 65.0 in | Wt 146.4 lb

## 2021-11-16 DIAGNOSIS — K2100 Gastro-esophageal reflux disease with esophagitis, without bleeding: Secondary | ICD-10-CM

## 2021-11-16 DIAGNOSIS — E7800 Pure hypercholesterolemia, unspecified: Secondary | ICD-10-CM | POA: Diagnosis not present

## 2021-11-16 DIAGNOSIS — I10 Essential (primary) hypertension: Secondary | ICD-10-CM

## 2021-11-16 DIAGNOSIS — K449 Diaphragmatic hernia without obstruction or gangrene: Secondary | ICD-10-CM | POA: Diagnosis not present

## 2021-11-16 MED ORDER — ROSUVASTATIN CALCIUM 10 MG PO TABS: 10.0000 mg | ORAL_TABLET | Freq: Every day | ORAL | 3 refills | Status: DC

## 2021-11-16 NOTE — Patient Instructions (Signed)
Medication Instructions:  START Rosuvastatin 10 mg once daily  *If you need a refill on your cardiac medications before your next appointment, please call your pharmacy*   Lab Work: Your provider would like for you to return in 3 months to have the following labs drawn: fasting lipid. You do not need an appointment for the lab. Once in our office lobby there is a podium where you can sign in and ring the doorbell to alert [place] that you are here. The lab is open from 8:00 am to 4 pm; closed for lunch from 12:45pm-1:45pm.  You may also go to any of these [facility] locations:   [place] ([place]) - [place] [employer] [address]    If you have labs (blood work) drawn today and your tests are completely normal, you will receive your results only by: [employer] (if you have MyChart) OR A paper copy in the mail If you have any lab test that is abnormal or we need to change your treatment, we will call you to review the results.   Testing/Procedures: None ordered   Follow-Up: At [facility], you and your health needs are our priority.  As part of our continuing mission to provide you with exceptional heart care, we have created designated Provider Care Teams.  These Care Teams include your primary Cardiologist (physician) and Advanced Practice Providers (APPs -  Physician Assistants and Nurse Practitioners) who all work together to provide you with the care you need, when you need it.  We recommend signing up for the patient portal called "MyChart".  Sign up information is provided on this After Visit Summary.  MyChart is used to connect with patients for Virtual Visits (Telemedicine).  Patients are able to view lab/test results, encounter notes, upcoming appointments, etc.  Non-urgent messages can be sent to your provider as well.   To learn more about what you can do with MyChart, go to  [email or web address].    Your next appointment:   12 month(s)  The format for your next appointment:   In Person  Provider:   Sanda Klein, MD {    Important Information About Sugar

## 2021-11-16 NOTE — Progress Notes (Signed)
Cardiology Office Note:    Date:  11/21/2021   ID:  Amy Cannon, DOB 06-29-1945, MRN [id number]  PCP:  Asencion Noble, MD   [place]  Cardiologist:  Sanda Klein, MD Delancey Moraes Advanced Practice Provider:  No care team member to display Electrophysiologist:  None       Referring MD: Asencion Noble, MD   No chief complaint on file.   History of Present Illness:    Amy Cannon is a 75 y.o. 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  She has recovered well from hiatal hernia surgery and subdural hematoma requiring surgical drainage after a horse riding accident.  She is riding her horse again.  She feels that she has recovered well.  She has severe hypercholesterolemia with an LDL cholesterol at baseline over 200.  I had misunderstood that she was statin intolerant.  She has never taken statins.  She simply had decided not to take statins after discussion with Dr. Rollene Fare since she did not have any clinical manifestations of CAD or PAD.  Blood pressure is well controlled.  The hiatal hernia has really helped with the reflux symptoms.  She is still taking H2 blockers but wants to avoid PPI due to osteopenia.  Past Medical History:  Diagnosis Date   Arthritis    Cancer ([place])    BASAL CELL SKIN CANCER   Essential hypertension 05/03/2014   GERD (gastroesophageal reflux disease)    History of hiatal hernia    LARGE   HTN (hypertension)    stress test 08-04-2007 EF 70%; low risk scan , no significant ischemia   Hyperlipemia    Murmur, cardiac    2d-echo on 09-08-2012 EF  55-60% normal study, mild LVH, no significant valve disease    Pneumonia     Past Surgical History:  Procedure Laterality Date   BURR HOLE Right 04/05/2021   Procedure: Right sided BURR HOLE evacuation of subdural hematoma;  Surgeon: Earnie Larsson, MD;  Location: [facility];  Service: Neurosurgery;  Laterality: Right;   CATARACT EXTRACTION W/ INTRAOCULAR LENS IMPLANT Bilateral    COLONOSCOPY     COLONOSCOPY WITH PROPOFOL N/A 01/13/2020   Procedure: COLONOSCOPY WITH PROPOFOL;  Surgeon: Robert Bellow, MD;  Location: [facility];  Service: Endoscopy;  Laterality: N/A;   complex repair wrist/hand/finger     ESOPHAGOGASTRODUODENOSCOPY     ESOPHAGOGASTRODUODENOSCOPY N/A 11/03/2020   Procedure: ESOPHAGOGASTRODUODENOSCOPY (EGD);  Surgeon: [name], DO;  Location: [facility];  Service: Gastroenterology;  Laterality: N/A;   ESOPHAGOGASTRODUODENOSCOPY (EGD) WITH PROPOFOL N/A 01/13/2020   Procedure: ESOPHAGOGASTRODUODENOSCOPY (EGD) WITH PROPOFOL;  Surgeon: Robert Bellow, MD;  Location: [facility];  Service: Endoscopy;  Laterality: N/A;   HIATAL HERNIA REPAIR N/A 11/03/2020   Procedure: LAPAROSCOPIC REPAIR OF HIATAL HERNIA;  Surgeon: Greer Pickerel, MD;  Location: [facility];  Service: General;  Laterality: N/A;   LAPAROSCOPIC APPENDECTOMY N/A 04/01/2020   Procedure: APPENDECTOMY LAPAROSCOPIC;  Surgeon: Robert Bellow, MD;  Location: [facility];  Service: General;  Laterality: N/A;   MINOR IRRIGATION AND DEBRIDEMENT OF WOUND Left    TONSILLECTOMY     TRANSORAL INCISIONLESS FUNDOPLICATION N/A 2/99/3716   Procedure: TRANSORAL INCISIONLESS FUNDOPLICATION;  Surgeon: [name], DO;  Location: [facility];  Service: Gastroenterology;  Laterality: N/A;    Current Medications: Current Meds  Medication Sig   Alpha-Lipoic Acid 600 MG CAPS Take 600 mg by mouth every evening.   B Complex Vitamins (VITAMIN-B COMPLEX PO) Take 1 tablet by mouth daily.    Beta Carotene (VITAMIN A) 25000 UNIT capsule Take 25,000 Units by mouth every Monday, Wednesday, and  Friday.   Biotin 1 MG CAPS Take 1 mg by mouth daily in the afternoon.   Cholecalciferol (VITAMIN D3) 10 MCG (400 UNIT) CAPS Take 400 Units by mouth daily.   Coenzyme Q10 (COQ-10 PO) Take 300 mg by mouth daily.   Cyanocobalamin (B-12 PO) Take 1,000 mcg by mouth daily.   famotidine (PEPCID) 40 MG tablet Take 40 mg by mouth daily.   irbesartan-hydrochlorothiazide (AVALIDE) 300-12.5 MG tablet Take 1 tablet by mouth daily. Please schedule appointment for refills. (Patient taking differently: Take 1 tablet by mouth at bedtime.)   Magnesium 400 MG CAPS Take 400 mg by mouth 2 (two) times daily.   Multiple Vitamin (MULTIVITAMIN WITH MINERALS) TABS tablet Take 1 tablet by mouth daily.   raloxifene (EVISTA) 60 MG tablet Take 60 mg by mouth every evening.    RESVERATROL PO Take 1 tablet by mouth daily. Vitamin supplement   rosuvastatin (CRESTOR) 10 MG tablet Take 1 tablet (10 mg total) by mouth daily.   tretinoin (RETIN-A) 0.1 % cream    vitamin C (ASCORBIC ACID) 500 MG tablet Take 500 mg by mouth daily.   vitamin E 180 MG (400 UNITS) capsule Take 400 Units by mouth daily.     Allergies:   Lisinopril   Social History   Socioeconomic History   Marital status: Widowed    Spouse name: Not on file   Number of children: Not on file   Years of education: Not on file   Highest education level: Not on file  Occupational History   Not on file  Tobacco Use   Smoking status: Former   Smokeless tobacco: Never  Vaping Use   Vaping Use: Never used  Substance and Sexual Activity   Alcohol use: Yes    Alcohol/week: 5.0 standard drinks of alcohol    Types: 5 Standard drinks or equivalent per week    Comment: SOCIALLY   Drug use: No   Sexual activity: Not on file  Other Topics Concern   Not on file  Social History Narrative   Not on file   Social Determinants of Health   Financial Resource Strain: Not on file  Food Insecurity: Not on file  Transportation Needs: Not on file  Physical Activity:  Not on file  Stress: Not on file  Social Connections: Not on file     Family History: The patient's family history includes CAD in an other family member; Cancer in her mother; Colon cancer in her mother. There is no history of Esophageal cancer, Rectal cancer, or Stomach cancer.  ROS:   Please see the history of present illness.     All other systems reviewed and are negative.  EKGs/Labs/Other Studies Reviewed:    The following studies were reviewed today:   EKG:  EKG is not ordered today.  Previous tracing shows normal sinus rhythm, low voltage in the lateral precordial leads probably due to soft tissue attenuation, otherwise normal tracing, no ischemic changes.  Recent Labs: 04/04/2021: ALT 8; Hemoglobin 13.2; Platelets 324 09/19/2021: BUN 11; Creatinine, Ser 0.82; Potassium 4.4; Sodium 140  Recent Lipid Panel  Component Value Date/Time   CHOL 307 (H) 09/19/2021 0947   TRIG 216 (H) 09/19/2021 0947   HDL 61 09/19/2021 0947   CHOLHDL 5.0 (H) 09/19/2021 0947   CHOLHDL 2.2 02/16/2013 1014   VLDL 15 02/16/2013 1014   LDLCALC 204 (H) 09/19/2021 0947   07/28/2019 HDL 77, total cholesterol 220, triglycerides 124  Risk Assessment/Calculations:       Physical Exam:    VS:  BP 130/80 (BP Location: Left Arm, Patient Position: Sitting, Cuff Size: Normal)   Pulse 74   Ht '5\' 5"'$  (1.651 m)   Wt 146 lb 6.4 oz (66.4 kg)   SpO2 94%   BMI 24.36 kg/m     Wt Readings from Last 3 Encounters:  11/16/21 146 lb 6.4 oz (66.4 kg)  09/28/21 148 lb 12.8 oz (67.5 kg)  04/05/21 145 lb (65.8 kg)     General: Alert, oriented x3, no distress.  Appears fit, younger than stated age Head: no evidence of trauma, PERRL, EOMI, no exophtalmos or lid lag, no myxedema, no xanthelasma; normal ears, nose and oropharynx Neck: normal jugular venous pulsations and no hepatojugular reflux; brisk carotid pulses without delay and no carotid bruits Chest: clear to auscultation, no signs of consolidation by  percussion or palpation, normal fremitus, symmetrical and full respiratory excursions Cardiovascular: normal position and quality of the apical impulse, regular rhythm, normal first and second heart sounds, no murmurs, rubs or gallops Abdomen: no tenderness or distention, no masses by palpation, no abnormal pulsatility or arterial bruits, normal bowel sounds, no hepatosplenomegaly Extremities: no clubbing, cyanosis or edema; 2+ radial, ulnar and brachial pulses bilaterally; 2+ right femoral, posterior tibial and dorsalis pedis pulses; 2+ left femoral, posterior tibial and dorsalis pedis pulses; no subclavian or femoral bruits Neurological: grossly nonfocal Psych: Normal mood and affect    ASSESSMENT:    1. Hypercholesterolemia   2. Essential hypertension   3. 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  She does not have statin myopathy, was simply concerned about possible side effects.  We will start treatment with rosuvastatin, recheck labs in a few months.  Target LDL reduction to less than 100 (over 50% reduction).  We may not be able to reach LDL less than 70 with conventional medications, but she does not have evidence of CAD or PAD and has a remote normal nuclear stress test. HTN: Well-controlled Chest pain/GERD: Normal stress test in 2014.  Symptoms improved following treatment with PPI plus H2 blockers and has not recurred now that she has had hiatal hernia surgery.        Medication Adjustments/Labs and Tests Ordered: Current medicines are reviewed at length with the patient today.  Concerns regarding medicines are outlined above.  Orders Placed This Encounter  Procedures   Lipid panel   Meds ordered this encounter  Medications   rosuvastatin (CRESTOR) 10 MG tablet    Sig: Take 1 tablet (10 mg total) by mouth daily.    Dispense:  90 tablet     Refill:  3    Patient Instructions  Medication Instructions:  START Rosuvastatin 10 mg once daily  *If you need a refill on your cardiac medications before your next appointment, please call your pharmacy*   Lab Work: Your provider would like for you to return in 3 months to have the following labs drawn: fasting lipid. You do  not need an appointment for the lab. Once in our office lobby there is a podium where you can sign in and ring the doorbell to alert [place] that you are here. The lab is open from 8:00 am to 4 pm; closed for lunch from 12:45pm-1:45pm.  You may also go to any of these [facility] locations:   [place] ([place]) - [place] [employer] [address]    If you have labs (blood work) drawn today and your tests are completely normal, you will receive your results only by: [employer] (if you have MyChart) OR A paper copy in the mail If you have any lab test that is abnormal or we need to change your treatment, we will call you to review the results.   Testing/Procedures: None ordered   Follow-Up: At [facility], you and your health needs are our priority.  As part of our continuing mission to provide you with exceptional heart care, we have created designated Provider Care Teams.  These Care Teams include your primary Cardiologist (physician) and Advanced Practice Providers (APPs -  Physician Assistants and Nurse Practitioners) who all work together to provide you with the care you need, when you need it.  We recommend signing up for the patient portal called "MyChart".  Sign up information is provided on this After Visit Summary.  MyChart is used to connect with patients for Virtual Visits (Telemedicine).  Patients are able to view lab/test results, encounter notes, upcoming appointments, etc.  Non-urgent messages can be sent to your provider as well.   To learn more about what you can do  with MyChart, go to [email or web address].    Your next appointment:   12 month(s)  The format for your next appointment:   In Person  Provider:   Sanda Klein, MD {    Important Information About Sugar         Signed, Sanda Klein, MD  11/21/2021 2:53 PM    [place]

## 2021-11-24 ENCOUNTER — Inpatient Hospital Stay: Admission: RE | Admit: 2021-11-24 | Payer: Medicare Other | Source: Ambulatory Visit

## 2021-11-24 ENCOUNTER — Other Ambulatory Visit (HOSPITAL_BASED_OUTPATIENT_CLINIC_OR_DEPARTMENT_OTHER): Payer: Medicare Other

## 2022-02-07 ENCOUNTER — Encounter: Payer: Self-pay | Admitting: Cardiovascular Disease

## 2022-02-07 DIAGNOSIS — R29898 Other symptoms and signs involving the musculoskeletal system: Secondary | ICD-10-CM

## 2022-02-08 NOTE — Telephone Encounter (Signed)
Thanks, [place]. Leqvio sounds like a great idea. Can you please schedule her for ABI?

## 2022-02-22 ENCOUNTER — Other Ambulatory Visit: Payer: Self-pay | Admitting: Cardiovascular Disease

## 2022-02-22 DIAGNOSIS — I739 Peripheral vascular disease, unspecified: Secondary | ICD-10-CM

## 2022-02-22 DIAGNOSIS — R29898 Other symptoms and signs involving the musculoskeletal system: Secondary | ICD-10-CM

## 2022-02-27 ENCOUNTER — Ambulatory Visit (HOSPITAL_COMMUNITY)
Admission: RE | Admit: 2022-02-27 | Discharge: 2022-02-27 | Disposition: A | Discharge status: Home / Self Care | Payer: Medicare Other | Source: Ambulatory Visit | Attending: Cardiovascular Disease | Admitting: Cardiovascular Disease

## 2022-02-27 DIAGNOSIS — R29898 Other symptoms and signs involving the musculoskeletal system: Secondary | ICD-10-CM | POA: Diagnosis not present

## 2022-02-27 DIAGNOSIS — I739 Peripheral vascular disease, unspecified: Secondary | ICD-10-CM | POA: Insufficient documentation

## 2022-03-09 ENCOUNTER — Telehealth: Payer: Self-pay | Admitting: Cardiovascular Disease

## 2022-03-09 NOTE — Telephone Encounter (Signed)
The patient stated that she would like to more about the trial  No evidence of any serious arterial blockages.  There is a clinical trial for a new cholesterol medication that is likely to be very appropriate for your particular lipid profile with high levels of lipoprotein LP(a). It is not the trial Laureen Abrahams may have mentioned (you do not meet the inclusion criteria for that one), but it is very similar. Would it be ok for the research team to reach out to you and discuss it?

## 2022-03-09 NOTE — Telephone Encounter (Signed)
Patient is returning call to discuss lower ext art results.

## 2022-03-12 NOTE — Telephone Encounter (Signed)
Can we please ask Research team to reach out to her re; LP(a) trial. Needs to understand that the drug is not experimental, it is already FDA approved, just not for that particular LP(a) abnormality

## 2022-04-19 ENCOUNTER — Telehealth: Payer: Self-pay | Admitting: Cardiovascular Disease

## 2022-04-19 ENCOUNTER — Other Ambulatory Visit: Payer: Self-pay

## 2022-04-19 DIAGNOSIS — E7800 Pure hypercholesterolemia, unspecified: Secondary | ICD-10-CM

## 2022-04-19 DIAGNOSIS — Z79899 Other long term (current) drug therapy: Secondary | ICD-10-CM

## 2022-04-19 DIAGNOSIS — Z419 Encounter for procedure for purposes other than remedying health state, unspecified: Secondary | ICD-10-CM

## 2022-04-19 NOTE — Telephone Encounter (Signed)
Patient said she is getting blood work tomorrow for lipid panel and wanted an Lp (a) done with it. Explained that lipoprotein (a) is a genetic marker and doesn't change. She wanted me to get an ordr for ti. Order obtained from Dr. Sallyanne Kuster. Order placed.

## 2022-04-19 NOTE — Telephone Encounter (Signed)
Patient is requesting to have a Lipoprotein order sent to [facility] in [place], [place].

## 2022-04-25 LAB — LIPID PANEL
Chol/HDL Ratio: 2.8 ratio (ref 0.0–4.4)
Cholesterol, Total: 187 mg/dL (ref 100–199)
HDL: 66 mg/dL (ref >39)
LDL Chol Calc (NIH): 101 mg/dL — ABNORMAL HIGH (ref 0–99)
Triglycerides: 113 mg/dL (ref 0–149)
VLDL Cholesterol Cal: 20 mg/dL (ref 5–40)

## 2022-04-25 LAB — LIPOPROTEIN A (LPA): Lipoprotein (a): 336 nmol/L — ABNORMAL HIGH (ref <75.0)

## 2022-04-26 ENCOUNTER — Encounter: Payer: Self-pay | Admitting: Cardiovascular Disease

## 2022-04-26 MED ORDER — ROSUVASTATIN CALCIUM 20 MG PO TABS: 20.0000 mg | ORAL_TABLET | Freq: Every day | ORAL | 3 refills | Status: DC

## 2022-10-03 ENCOUNTER — Telehealth: Payer: Self-pay | Admitting: Cardiovascular Disease

## 2022-10-03 ENCOUNTER — Telehealth: Payer: Self-pay

## 2022-10-03 DIAGNOSIS — E7800 Pure hypercholesterolemia, unspecified: Secondary | ICD-10-CM

## 2022-10-03 DIAGNOSIS — I10 Essential (primary) hypertension: Secondary | ICD-10-CM

## 2022-10-03 NOTE — Telephone Encounter (Signed)
Pt c/o BP issue: STAT if pt c/o blurred vision, one-sided weakness or slurred speech  1. What are your last 5 BP readings? 154/99 on the 11th   2. Are you having any other symptoms (ex. Dizziness, headache, blurred vision, passed out)? Some dizziness and feeling very fatigue lately   3. What is your BP issue? Pt is very concerned about her BP being high and Cholesterol.

## 2022-10-03 NOTE — Telephone Encounter (Signed)
Patient concerns of her cholesterol and her BP.  Advised that she can be seen at appt to discuss.  Until appt she should monitor and keep a log of BP as she states issues with it.  She will keep a log and any symptoms.  She ask for labs and notated will order the basics she had last OV.  She ask about lyme disease due to fatigue and advised that would need to be discussed at OV as there are many reasons for fatigue.  Labs ordered, appt made

## 2022-10-03 NOTE — Telephone Encounter (Signed)
LVM to patient that the LPA was cancelled due to reasoning per provider.

## 2022-10-03 NOTE — Telephone Encounter (Signed)
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

## 2022-10-03 NOTE — Telephone Encounter (Signed)
Patient upcoming appt and I ordered Lipid panel and BMP as previous.  Also ordered LPA, however she has two iin her chart from May and December of 2023.  Should I cancel the LPA for upcoming appt??

## 2022-11-08 ENCOUNTER — Other Ambulatory Visit: Payer: Self-pay

## 2022-11-08 DIAGNOSIS — E7800 Pure hypercholesterolemia, unspecified: Secondary | ICD-10-CM

## 2022-11-08 DIAGNOSIS — I10 Essential (primary) hypertension: Secondary | ICD-10-CM

## 2022-11-08 LAB — LIPID PANEL

## 2022-11-08 NOTE — Progress Notes (Unsigned)
Cardiology Office Note:    Date:  11/14/2022   ID:  Amy Cannon, DOB November 20, 1945, MRN [id number]  PCP:  Carylon Perches, MD   [facility] Providers Cardiologist:  Thurmon Fair, MD     Referring MD: Carylon Perches, MD   Chief Complaint  Patient presents with   Follow-up    Coronary calcifications    History of Present Illness:    Amy Cannon is a 77 y.o. 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 She had a horse riding accident and suffered a subdural hematoma requiring surgical drainage. HLD now controlled with crestor.   LE dopplers negative for arterial disease.   She presents for routine follow up. She brings BP log is is largely controlled. She is doing well on irbesartan and hydrochlorothiazide. Her main complaint is fatigue. She is friends with Dr. Alanda Amass.    Past Medical History:  Diagnosis Date   Arthritis    Cancer (HCC)    BASAL CELL SKIN CANCER   Essential hypertension 05/03/2014   GERD (gastroesophageal reflux disease)    History of hiatal hernia    LARGE   HTN (hypertension)    stress test 08-04-2007 EF 70%; low risk scan , no significant ischemia   Hyperlipemia    Murmur, cardiac    2d-echo on 09-08-2012 EF  55-60% normal study, mild LVH, no significant valve disease   Pneumonia     Past Surgical History:  Procedure Laterality Date   BURR HOLE Right 04/05/2021   Procedure: Right sided BURR HOLE evacuation of subdural hematoma;  Surgeon: Julio Sicks, MD;  Location: [facility] OR;  Service: Neurosurgery;  Laterality: Right;   CATARACT EXTRACTION W/ INTRAOCULAR LENS IMPLANT Bilateral    COLONOSCOPY     COLONOSCOPY WITH PROPOFOL N/A 01/13/2020   Procedure: COLONOSCOPY WITH PROPOFOL;  Surgeon: Earline Mayotte, MD;  Location: [facility] ENDOSCOPY;  Service: Endoscopy;  Laterality: N/A;   complex repair wrist/hand/finger     ESOPHAGOGASTRODUODENOSCOPY      ESOPHAGOGASTRODUODENOSCOPY N/A 11/03/2020   Procedure: ESOPHAGOGASTRODUODENOSCOPY (EGD);  Surgeon: Shellia Cleverly, DO;  Location: [facility] ORS;  Service: Gastroenterology;  Laterality: N/A;   ESOPHAGOGASTRODUODENOSCOPY (EGD) WITH PROPOFOL N/A 01/13/2020   Procedure: ESOPHAGOGASTRODUODENOSCOPY (EGD) WITH PROPOFOL;  Surgeon: Earline Mayotte, MD;  Location: [facility] ENDOSCOPY;  Service: Endoscopy;  Laterality: N/A;   HIATAL HERNIA REPAIR N/A 11/03/2020   Procedure: LAPAROSCOPIC REPAIR OF HIATAL HERNIA;  Surgeon: Gaynelle Adu, MD;  Location: [facility];  Service: General;  Laterality: N/A;   LAPAROSCOPIC APPENDECTOMY N/A 04/01/2020   Procedure: APPENDECTOMY LAPAROSCOPIC;  Surgeon: Earline Mayotte, MD;  Location: [facility] ORS;  Service: General;  Laterality: N/A;   MINOR IRRIGATION AND DEBRIDEMENT OF WOUND Left    TONSILLECTOMY     TRANSORAL INCISIONLESS FUNDOPLICATION N/A 11/03/2020   Procedure: TRANSORAL INCISIONLESS FUNDOPLICATION;  Surgeon: Shellia Cleverly, DO;  Location: [facility];  Service: Gastroenterology;  Laterality: N/A;    Current Medications: Current Meds  Medication Sig   Alpha-Lipoic Acid 600 MG CAPS Take 600 mg by mouth every evening.   Aspirin-Calcium Carbonate (BAYER WOMENS) 81-777 MG TABS Take 81 mg by mouth at bedtime.   B Complex Vitamins (VITAMIN-B COMPLEX PO) Take 1 tablet by mouth daily.    Beta Carotene (VITAMIN A) 25000 UNIT capsule Take 25,000 Units by mouth every Monday, Wednesday, and Friday.   Biotin 1 MG CAPS Take 1 mg by mouth daily in  the afternoon.   Cholecalciferol (VITAMIN D3) 10 MCG (400 UNIT) CAPS Take 400 Units by mouth daily.   Coenzyme Q10 (COQ-10 PO) Take 300 mg by mouth daily.   Cyanocobalamin (B-12 PO) Take 1,000 mcg by mouth daily.   famotidine (PEPCID) 40 MG tablet Take 40 mg by mouth 2 (two) times daily.   irbesartan-hydrochlorothiazide (AVALIDE) 300-12.5 MG tablet Take 1 tablet by mouth daily. Please schedule appointment for refills. (Patient taking  differently: Take 1 tablet by mouth at bedtime.)   Magnesium 400 MG CAPS Take 400 mg by mouth 2 (two) times daily.   Multiple Vitamin (MULTIVITAMIN WITH MINERALS) TABS tablet Take 1 tablet by mouth daily.   raloxifene (EVISTA) 60 MG tablet Take 60 mg by mouth every evening.    RESVERATROL PO Take 1 tablet by mouth daily. Vitamin supplement   tretinoin (RETIN-A) 0.1 % cream    vitamin C (ASCORBIC ACID) 500 MG tablet Take 500 mg by mouth daily.   vitamin E 180 MG (400 UNITS) capsule Take 400 Units by mouth daily.   [DISCONTINUED] rosuvastatin (CRESTOR) 20 MG tablet Take 1 tablet (20 mg total) by mouth daily.     Allergies:   Lisinopril   Social History   Socioeconomic History   Marital status: Widowed    Spouse name: Not on file   Number of children: Not on file   Years of education: Not on file   Highest education level: Not on file  Occupational History   Not on file  Tobacco Use   Smoking status: Former   Smokeless tobacco: Never  Vaping Use   Vaping Use: Never used  Substance and Sexual Activity   Alcohol use: Yes    Alcohol/week: 5.0 standard drinks of alcohol    Types: 5 Standard drinks or equivalent per week    Comment: SOCIALLY   Drug use: No   Sexual activity: Not on file  Other Topics Concern   Not on file  Social History Narrative   Not on file   Social Determinants of Health   Financial Resource Strain: Not on file  Food Insecurity: Not on file  Transportation Needs: Not on file  Physical Activity: Not on file  Stress: Not on file  Social Connections: Not on file     Family History: The patient's family history includes CAD in an other family member; Cancer in her mother; Colon cancer in her mother. There is no history of Esophageal cancer, Rectal cancer, or Stomach cancer.  ROS:   Please see the history of present illness.     All other systems reviewed and are negative.  EKGs/Labs/Other Studies Reviewed:    The following studies were reviewed  today: Cardiac Studies & Procedures     STRESS TESTS  NM MYOCAR MULTI W/SPECT W 03/04/2013      CT SCANS  CT CARDIAC SCORING (SELF PAY ONLY) 11/10/2021  Addendum 11/10/2021  8:06 PM ADDENDUM REPORT: 11/10/2021 20:04  CLINICAL DATA:  Cardiovascular Disease Risk stratification  EXAM: Coronary Calcium Score  TECHNIQUE: A gated, non-contrast computed tomography scan of the heart was performed using 3mm slice thickness. Axial images were analyzed on a dedicated workstation. Calcium scoring of the coronary arteries was performed using the Agatston method.  FINDINGS: Coronary Calcium Score:  Left main: 0  Left anterior descending artery: 36.2  Left circumflex artery: 81.3  Right coronary artery: 0  Total: 117.5  Percentile: 65th  Pericardium: Normal.  Non-cardiac: See separate report from [facility].  IMPRESSION: 1.  Coronary calcium score of 117.5. This was 65th percentile for age-, race-, and sex-matched controls.  2.  Mild dilation of the ascending aorta up to 40 mm.  RECOMMENDATIONS: Coronary artery calcium (CAC) score is a strong predictor of incident coronary heart disease (CHD) and provides predictive information beyond traditional risk factors. CAC scoring is reasonable to use in the decision to withhold, postpone, or initiate statin therapy in intermediate-risk or selected borderline-risk asymptomatic adults (age 94-75 years and LDL-C >=70 to <190 mg/dL) who do not have diabetes or established atherosclerotic cardiovascular disease (ASCVD).* In intermediate-risk (10-year ASCVD risk >=7.5% to <20%) adults or selected borderline-risk (10-year ASCVD risk >=5% to <7.5%) adults in whom a CAC score is measured for the purpose of making a treatment decision the following recommendations have been made:  If CAC=0, it is reasonable to withhold statin therapy and reassess in 5 to 10 years, as long as higher risk conditions are absent (diabetes  mellitus, family history of premature CHD in first degree relatives (males <55 years; females <65 years), cigarette smoking, or LDL >=190 mg/dL).  If CAC is 1 to 99, it is reasonable to initiate statin therapy for patients >=11 years of age.  If CAC is >=100 or >=75th percentile, it is reasonable to initiate statin therapy at any age.  Cardiology referral should be considered for patients with CAC scores >=400 or >=75th percentile.  *2018 AHA/ACC/AACVPR/AAPA/ABC/ACPM/ADA/AGS/APhA/ASPC/NLA/PCNA Guideline on the Management of Blood Cholesterol: A Report of the American College of Cardiology/American Heart Association Task Force on Clinical Practice Guidelines. J Am Coll Cardiol. 2019;73(24):3168-3209.  Lennie Odor, MD   Electronically Signed By: Lennie Odor M.D. On: 11/10/2021 20:04  Narrative EXAM: OVER-READ INTERPRETATION  CT CHEST  The following report is a limited chest CT over-read performed by radiologist Dr. Caprice Renshaw of [facility] on 11/10/2021. This over-read does not include interpretation of cardiac or coronary anatomy or pathology. The coronary calcium interpretation by the cardiologist is attached.  COMPARISON:  CT 01/28/2020.  FINDINGS: Vascular: Ascending aorta measures up to 4.0 cm. (Series 3, image 7). Coronary artery calcifications.  Mediastinum/Nodes: No lymphadenopathy.  Lungs/Pleura: No focal airspace disease. There is a 6 mm right basilar pulmonary nodule (series 2, image 61). This is stable since September 2021. There is an additional pulmonary nodule in the left lung base measuring up to 7 mm, also stable since September 2021 (series 2, image 69).  Upper Abdomen: Small hiatal hernia.  Musculoskeletal: No acute osseous abnormality. Chronic anterior T12 compression deformity. No suspicious osseous lesions.  IMPRESSION: Bilateral basilar pulmonary nodules largest measuring 7 mm in the left lung base. These are both stable  since September 2021, and likely benign. If patient is high risk would consider a single additional follow-up CT in 12 months. (Fleischner guidelines, reference: Radiology. 2017; 284(1):228-43.).  Mildly dilated ascending aorta measuring up to 4.0 cm. Recommend annual imaging followup by CTA or MRA. This recommendation follows 2010 ACCF/AHA/AATS/ACR/ASA/SCA/SCAI/SIR/STS/SVM Guidelines for the Diagnosis and Management of Patients with Thoracic Aortic Disease. Circulation. 2010; 121: B762-G315. Aortic aneurysm NOS (ICD10-I71.9).  Small hiatal hernia.  Interpretation of cardiac/coronary anatomy by cardiology to follow.  Electronically Signed: By: Caprice Renshaw M.D. On: 11/10/2021 13:45          EKG Interpretation  Date/Time:  Wednesday November 14 2022 12:00:45 EDT Ventricular Rate:  60 PR Interval:  154 QRS Duration: 78 QT Interval:  412 QTC Calculation: 412 R Axis:   23 Text Interpretation: Normal sinus rhythm Low voltage QRS Cannot  rule out Anterior infarct , age undetermined When compared with ECG of 04-Apr-2021 23:46, PREVIOUS ECG IS PRESENT Confirmed by Micah Flesher ([id number]) on 11/14/2022 12:18:10 PM    Recent Labs: 11/08/2022: BUN 11; Creatinine, Ser 0.78; Potassium 4.0; Sodium 141  Recent Lipid Panel    Component Value Date/Time   CHOL 165 11/08/2022 0934   TRIG 115 11/08/2022 0934   HDL 67 11/08/2022 0934   CHOLHDL 2.5 11/08/2022 0934   CHOLHDL 2.2 02/16/2013 1014   VLDL 15 02/16/2013 1014   LDLCALC 78 11/08/2022 0934     Risk Assessment/Calculations:                Physical Exam:    VS:  BP 130/78 (BP Location: Left Arm, Patient Position: Sitting, Cuff Size: Normal)   Pulse 60   Ht 5\' 5"  (1.651 m)   Wt 144 lb 12.8 oz (65.7 kg)   SpO2 98%   BMI 24.10 kg/m     Wt Readings from Last 3 Encounters:  11/14/22 144 lb 12.8 oz (65.7 kg)  11/16/21 146 lb 6.4 oz (66.4 kg)  09/28/21 148 lb 12.8 oz (67.5 kg)     GEN:  Well nourished, well developed in no acute  distress HEENT: Normal NECK: No JVD; No carotid bruits LYMPHATICS: No lymphadenopathy CARDIAC: RRR, no murmurs, rubs, gallops RESPIRATORY:  Clear to auscultation without rales, wheezing or rhonchi  ABDOMEN: Soft, non-tender, non-distended MUSCULOSKELETAL:  No edema; No deformity  SKIN: Warm and dry NEUROLOGIC:  Alert and oriented x 3 PSYCHIATRIC:  Normal affect   ASSESSMENT:    1. Elevated coronary artery calcium score   2. Primary hypertension   3. Hyperlipidemia with target LDL less than 70   4. Hiatal hernia   5. Gastroesophageal reflux disease with esophagitis without hemorrhage   6. Chest pain of uncertain etiology   7. Other fatigue   8. Encounter for screening for diabetes mellitus    PLAN:    In order of problems listed above:  Elevated coronary calcium score Risk factor modification No chest pain Controlling cholesterol   Fatigue Will check CBC, TSH, Mg, Vit D, A1c She inquires about sed rate and CRP - no chest pain or recent illness, will hold off on these for now   Hyperlipidemia  11/08/2022: Cholesterol, Total 165; HDL 67; LDL Chol Calc (NIH) 78; Triglycerides 115 Well controlled with crestor LP(a) 336 Will reach out to Dr. Royann Shivers to see if she would qualify for a clinical trial, she is still interested - did discuss increasing crestor to 40 mg - she will take 2 - 20 mg tablets and let us know, if she tolerates, will need a prescription for 40 mg tablets of crestor   Hypertension Controlled with present medications - elevated after a night of poor sleep or after a high sodium meal No medication changes today   Chest pain GERD Nonischemic stress test in 2014 Symptoms improved with GERD treatment   Follow up in 1 year.      Medication Adjustments/Labs and Tests Ordered: Current medicines are reviewed at length with the patient today.  Concerns regarding medicines are outlined above.  Orders Placed This Encounter  Procedures   CBC   TSH    Magnesium   Hemoglobin A1c   Vitamin D, 25-Hydroxy, Total   EKG 12-Lead   Meds ordered this encounter  Medications   rosuvastatin (CRESTOR) 40 MG tablet    Sig: Take 1 tablet (40 mg total) by mouth daily.  Dispense:  90 tablet    Refill:  3    Dose change new Rx    Patient Instructions  Medication Instructions:   INCREASE Crestor to 40 mg (2-20 mg tablets) daily. If tolerated call our office for new Rx for Crestor 40 mg daily   *If you need a refill on your cardiac medications before your next appointment, please call your pharmacy*  Lab Work: Your physician recommends that you have lab work TODAY:   A1c CBC Magnesium  TSH Vitamin D   If you have labs (blood work) drawn today and your tests are completely normal, you will receive your results only by: MyChart Message (if you have MyChart) OR A paper copy in the mail If you have any lab test that is abnormal or we need to change your treatment, we will call you to review the results.  Testing/Procedures: NONE ordered at this time of appointment   Follow-Up: At [facility], you and your health needs are our priority.  As part of our continuing mission to provide you with exceptional heart care, we have created designated Provider Care Teams.  These Care Teams include your primary Cardiologist (physician) and Advanced Practice Providers (APPs -  Physician Assistants and Nurse Practitioners) who all work together to provide you with the care you need, when you need it.    Your next appointment:   1 year(s)  Provider:   Thurmon Fair, MD     Other Instructions     Signed, Roe Rutherford Dhiren Azimi, PA  11/14/2022 1:02 PM    [facility]

## 2022-11-09 LAB — BASIC METABOLIC PANEL
BUN/Creatinine Ratio: 14 (ref 12–28)
BUN: 11 mg/dL (ref 8–27)
CO2: 23 mmol/L (ref 20–29)
Calcium: 10.2 mg/dL (ref 8.7–10.3)
Chloride: 106 mmol/L (ref 96–106)
Creatinine, Ser: 0.78 mg/dL (ref 0.57–1.00)
Glucose: 95 mg/dL (ref 70–99)
Potassium: 4 mmol/L (ref 3.5–5.2)
Sodium: 141 mmol/L (ref 134–144)
eGFR: 79 mL/min/{1.73_m2} (ref >59)

## 2022-11-09 LAB — LIPID PANEL
Chol/HDL Ratio: 2.5 ratio (ref 0.0–4.4)
Cholesterol, Total: 165 mg/dL (ref 100–199)
HDL: 67 mg/dL (ref >39)
LDL Chol Calc (NIH): 78 mg/dL (ref 0–99)
Triglycerides: 115 mg/dL (ref 0–149)
VLDL Cholesterol Cal: 20 mg/dL (ref 5–40)

## 2022-11-14 ENCOUNTER — Ambulatory Visit: Payer: Medicare Other | Attending: Physician Assistant | Admitting: Physician Assistant

## 2022-11-14 ENCOUNTER — Encounter: Payer: Self-pay | Admitting: Physician Assistant

## 2022-11-14 VITALS — BP 130/78 | HR 60 | Ht 65.0 in | Wt 144.8 lb

## 2022-11-14 DIAGNOSIS — R931 Abnormal findings on diagnostic imaging of heart and coronary circulation: Secondary | ICD-10-CM

## 2022-11-14 DIAGNOSIS — E785 Hyperlipidemia, unspecified: Secondary | ICD-10-CM

## 2022-11-14 DIAGNOSIS — K2100 Gastro-esophageal reflux disease with esophagitis, without bleeding: Secondary | ICD-10-CM | POA: Diagnosis present

## 2022-11-14 DIAGNOSIS — K449 Diaphragmatic hernia without obstruction or gangrene: Secondary | ICD-10-CM

## 2022-11-14 DIAGNOSIS — R079 Chest pain, unspecified: Secondary | ICD-10-CM

## 2022-11-14 DIAGNOSIS — R5383 Other fatigue: Secondary | ICD-10-CM

## 2022-11-14 DIAGNOSIS — Z131 Encounter for screening for diabetes mellitus: Secondary | ICD-10-CM

## 2022-11-14 DIAGNOSIS — I10 Essential (primary) hypertension: Secondary | ICD-10-CM

## 2022-11-14 MED ORDER — ROSUVASTATIN CALCIUM 40 MG PO TABS: 40.0000 mg | ORAL_TABLET | Freq: Every day | ORAL | 3 refills | Status: DC

## 2022-11-14 NOTE — Patient Instructions (Signed)
Medication Instructions:   INCREASE Crestor to 40 mg (2-20 mg tablets) daily. If tolerated call our office for new Rx for Crestor 40 mg daily   *If you need a refill on your cardiac medications before your next appointment, please call your pharmacy*  Lab Work: Your physician recommends that you have lab work TODAY:   A1c CBC Magnesium  TSH Vitamin D   If you have labs (blood work) drawn today and your tests are completely normal, you will receive your results only by: MyChart Message (if you have MyChart) OR A paper copy in the mail If you have any lab test that is abnormal or we need to change your treatment, we will call you to review the results.  Testing/Procedures: NONE ordered at this time of appointment   Follow-Up: At [facility], you and your health needs are our priority.  As part of our continuing mission to provide you with exceptional heart care, we have created designated Provider Care Teams.  These Care Teams include your primary Cardiologist (physician) and Advanced Practice Providers (APPs -  Physician Assistants and Nurse Practitioners) who all work together to provide you with the care you need, when you need it.    Your next appointment:   1 year(s)  Provider:   Thurmon Fair, MD     Other Instructions

## 2022-11-15 LAB — VITAMIN D, 25-HYDROXY, TOTAL

## 2022-11-19 ENCOUNTER — Telehealth: Payer: Self-pay

## 2022-11-19 NOTE — Telephone Encounter (Signed)
Left a detailed message with lab results and recommendations. Pt will continue current medication and f/u as planned.

## 2022-11-25 LAB — TSH: TSH: 2.59 u[IU]/mL (ref 0.450–4.500)

## 2022-11-25 LAB — HEMOGLOBIN A1C
Est. average glucose Bld gHb Est-mCnc: 108 mg/dL
Hgb A1c MFr Bld: 5.4 % (ref 4.8–5.6)

## 2022-11-25 LAB — CBC
Hematocrit: 44.8 % (ref 34.0–46.6)
Hemoglobin: 14.8 g/dL (ref 11.1–15.9)
MCH: 30.4 pg (ref 26.6–33.0)
MCHC: 33 g/dL (ref 31.5–35.7)
MCV: 92 fL (ref 79–97)
Platelets: 280 10*3/uL (ref 150–450)
RBC: 4.87 x10E6/uL (ref 3.77–5.28)
RDW: 12.6 % (ref 11.7–15.4)
WBC: 7.3 10*3/uL (ref 3.4–10.8)

## 2022-11-25 LAB — MAGNESIUM: Magnesium: 2.2 mg/dL (ref 1.6–2.3)

## 2022-11-28 ENCOUNTER — Telehealth: Payer: Self-pay

## 2022-11-28 NOTE — Telephone Encounter (Signed)
Spoke with pt. Pt was notified of lab results. Pt is aware that she is to take Vitamin D otc 1000-2000 IU daily and have PCP repeat labs in 6 months.

## 2022-11-29 ENCOUNTER — Encounter: Payer: Self-pay | Admitting: Cardiovascular Disease

## 2023-01-08 MED ORDER — EZETIMIBE 10 MG PO TABS: 10.0000 mg | ORAL_TABLET | Freq: Every day | ORAL | 3 refills | Status: DC

## 2023-02-28 ENCOUNTER — Other Ambulatory Visit: Payer: Self-pay | Admitting: Internal Medicine

## 2023-03-04 ENCOUNTER — Telehealth: Payer: Self-pay | Admitting: Cardiovascular Disease

## 2023-03-04 NOTE — Telephone Encounter (Signed)
*  STAT* If patient is at the pharmacy, call can be transferred to refill team.   1. Which medications need to be refilled? (please list name of each medication and dose if known)  rosuvastatin (CRESTOR) 40 MG tablet  2. Which pharmacy/location (including street and city if local pharmacy) is medication to be sent to? [facility]  [facility] - [place], [place] - [address]   3. Do they need a 30 day or 90 day supply?  90 day supply

## 2023-03-05 IMAGING — CT CT HEAD W/O CM
3 series · 15 of 47 positions shown, 18 images · non-contrast
Comparison: 03/16/2021

CLINICAL DATA: Follow-up subdural hemorrhage

EXAM:
CT HEAD WITHOUT CONTRAST
TECHNIQUE: Contiguous axial images were obtained from the base of the skull
through the vertex without intravenous contrast.

[Series 2: head w o · axial · 0.40mm/px · z∈[+34,+159]mm · 9 of 30 slices shown, 12 images]
[im 3/30  brain]
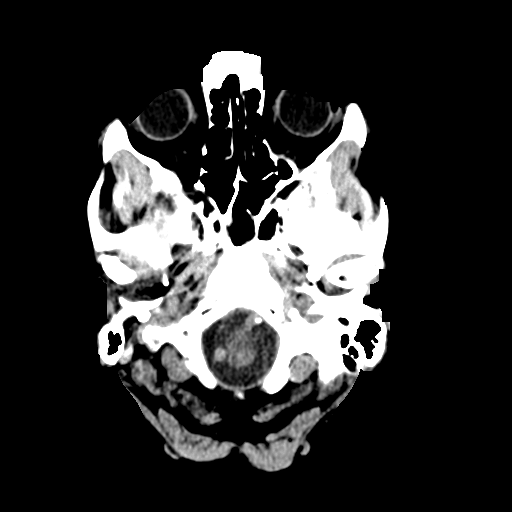
[im 3/30  bone]
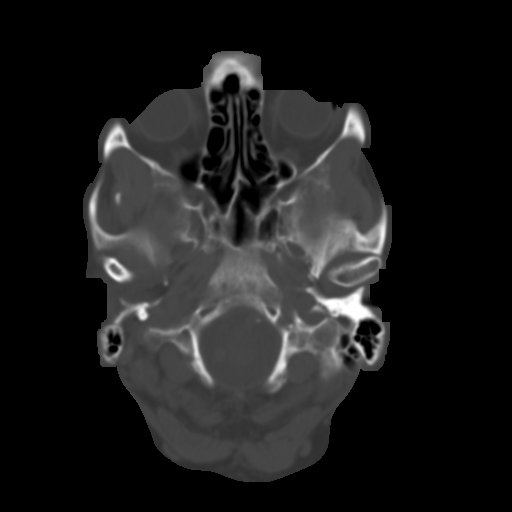
[im 6/30  brain]
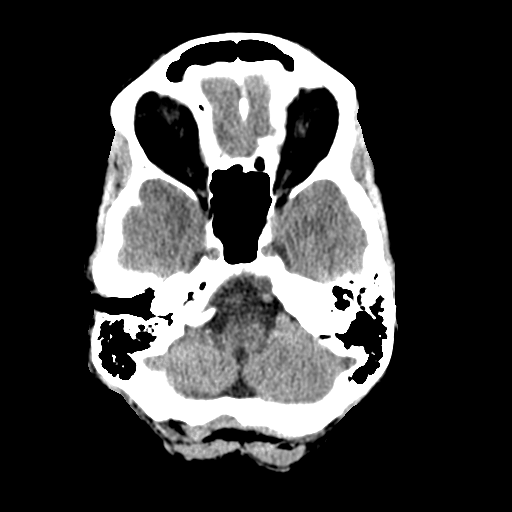
[im 9/30  brain]
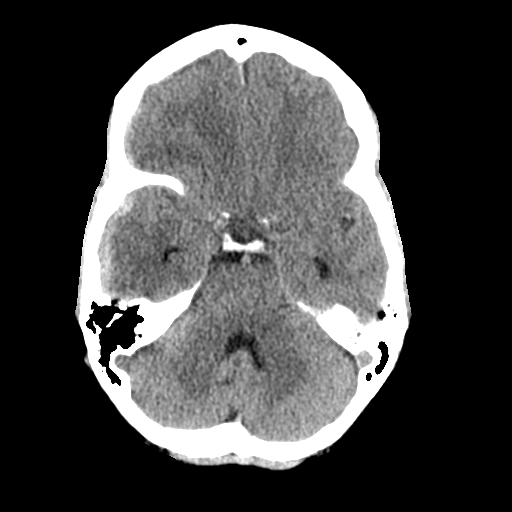
[im 12/30  brain]
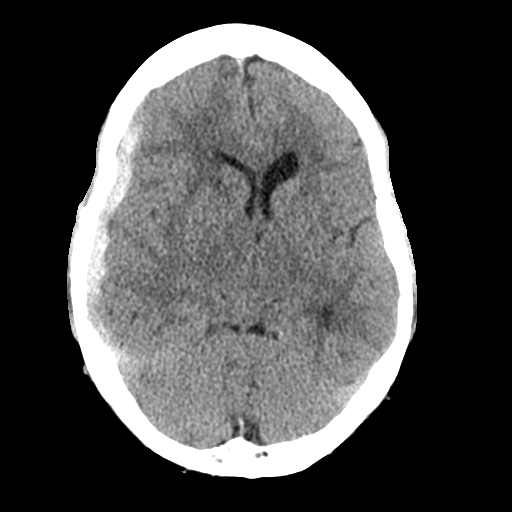
[im 16/30  brain]
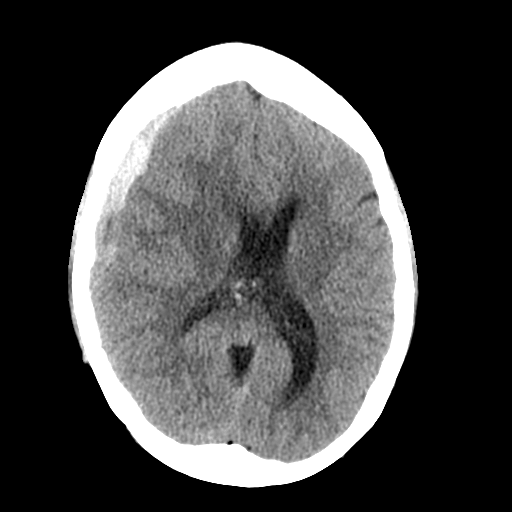
[im 16/30  bone]
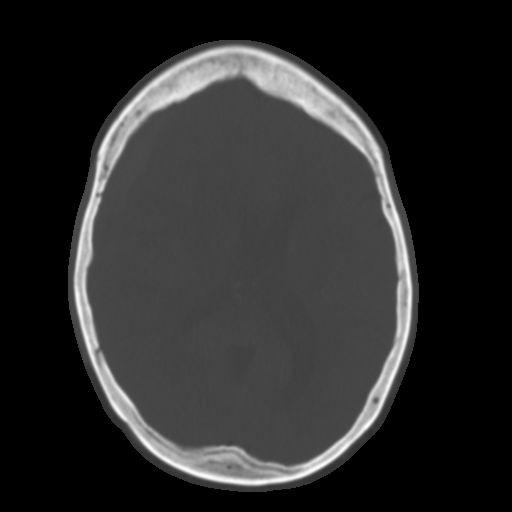
[im 19/30  brain]
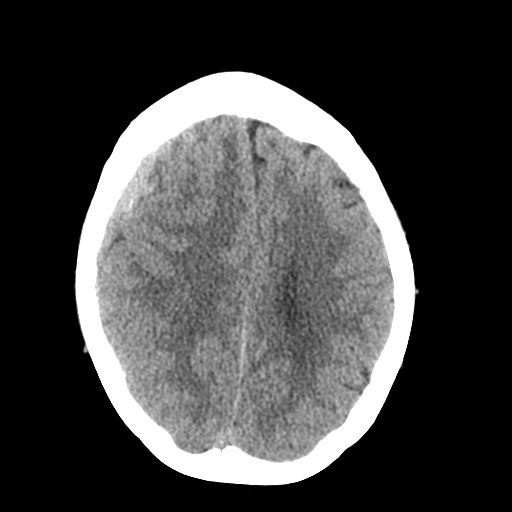
[im 22/30  brain]
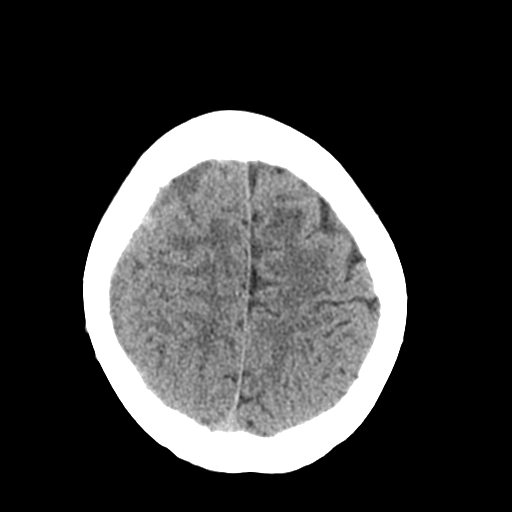
[im 25/30  brain]
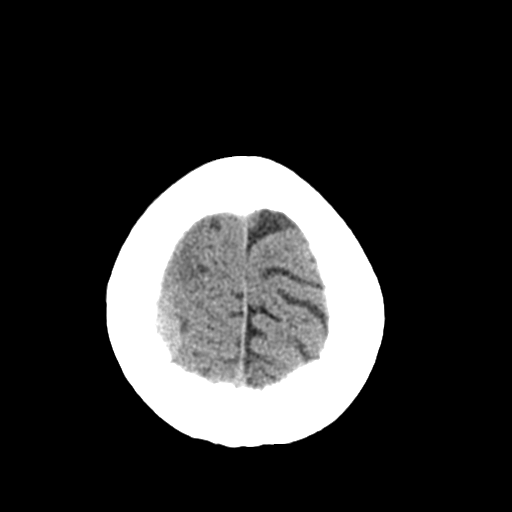
[im 28/30  brain]
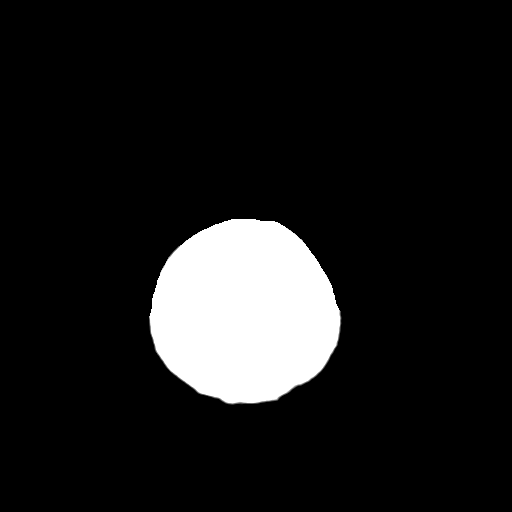
[im 28/30  bone]
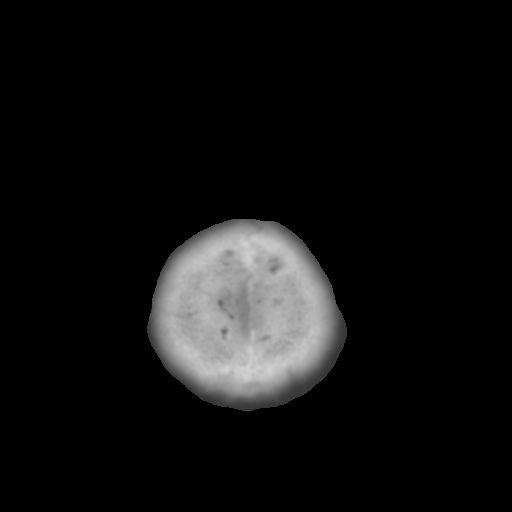

[Series 4: coronal soft · coronal · 0.30mm/px · 3 of 67 slices shown]
[im 23/67  brain]
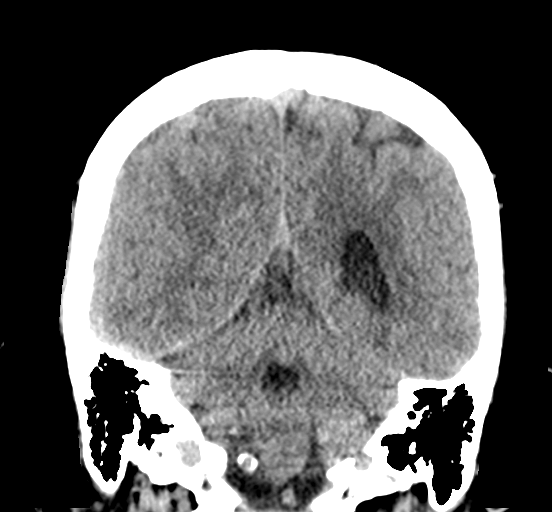
[im 30/67  brain]
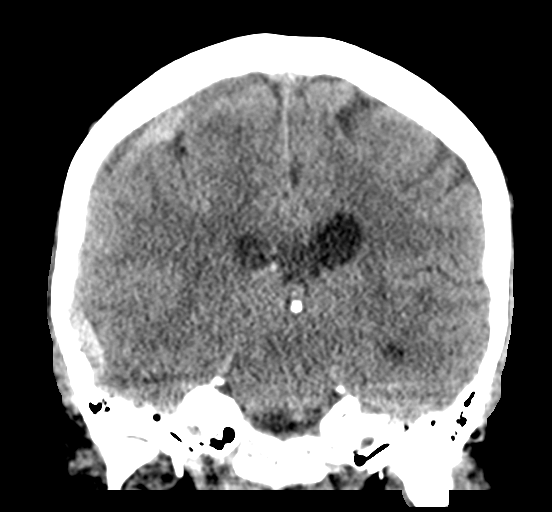
[im 37/67  brain]
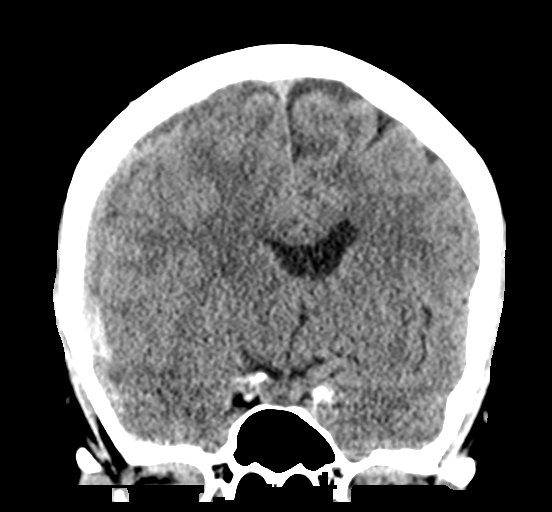

[Series 5: sagittal soft · sagittal · 0.34mm/px · 3 of 52 slices shown]
[im 18/52  brain]
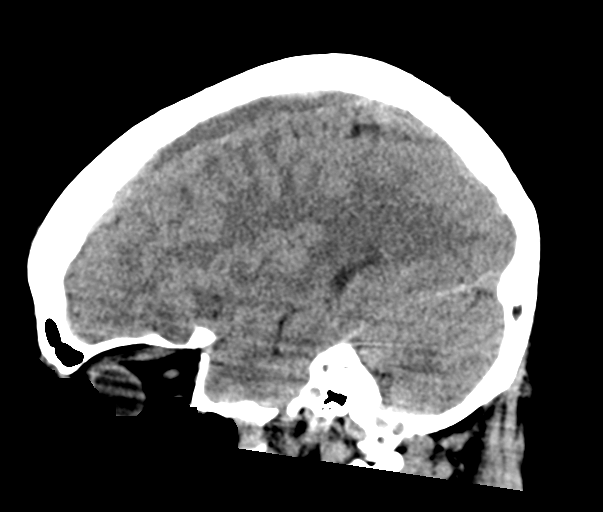
[im 26/52  brain]
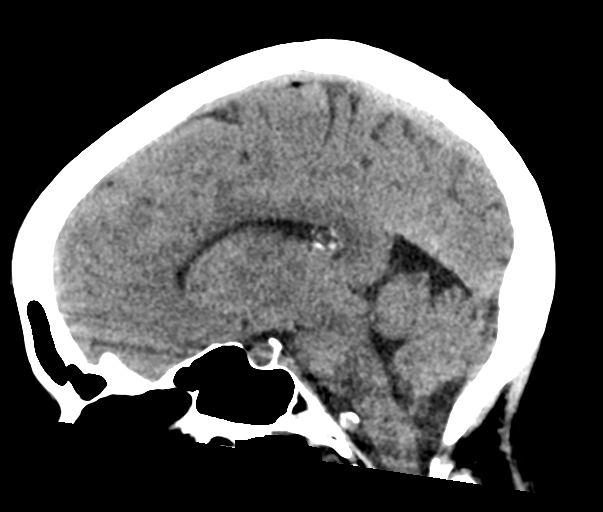
[im 35/52  brain]
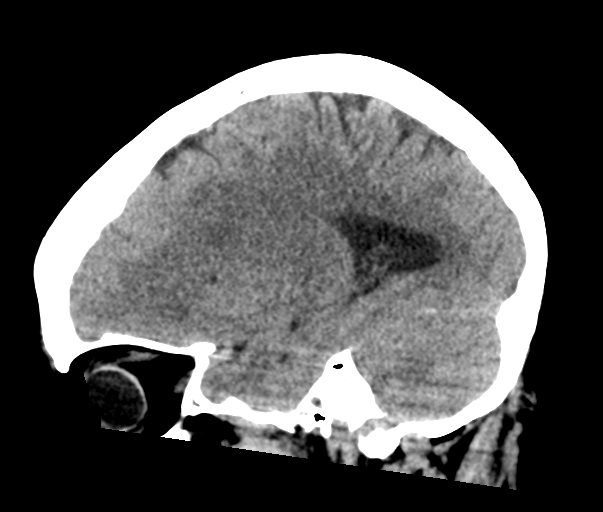

[15 of 47 positions shown; findings below may reference images not displayed]

FINDINGS: Brain: Redemonstrated 10 mm subdural hematoma along the right
frontal convexity, which is unchanged in size. The more anterior
portion of the hematoma is also unchanged in density, with decreased
density in the more posterior component, likely acute on chronic
hemorrhage. Unchanged 7 mm of right-to-left midline shift when
remeasured similarly. Overall unchanged mass effect on the right
lateral ventricle, with slight increase in the size of the left
lateral ventricle. No acute infarct, intraparenchymal hemorrhage, or
mass.

Vascular: No hyperdense vessel.

Skull: No acute osseous abnormality.

Sinuses/Orbits: No acute finding.

Other: The mastoids are well aerated.
IMPRESSION: 1. Unchanged size of a right frontal convexity subdural hematoma
with unchanged right to left midline shift, with persistent high
density blood products, concerning for acute on chronic hemorrhage.
2. Unchanged mass effect on the right lateral ventricle, with slight
increase in the size of the left lateral ventricle which could
indicate developing entrapment or hydrocephalus. Attention on
follow-up.

## 2023-03-26 ENCOUNTER — Encounter: Payer: Self-pay | Admitting: Cardiovascular Disease

## 2023-03-26 ENCOUNTER — Other Ambulatory Visit: Payer: Self-pay | Admitting: Pharmacist

## 2023-03-26 DIAGNOSIS — I2510 Atherosclerotic heart disease of native coronary artery without angina pectoris: Secondary | ICD-10-CM

## 2023-03-26 DIAGNOSIS — E785 Hyperlipidemia, unspecified: Secondary | ICD-10-CM | POA: Insufficient documentation

## 2023-03-27 ENCOUNTER — Telehealth: Payer: Self-pay

## 2023-03-27 NOTE — Telephone Encounter (Signed)
Dr. Royann Shivers and Efraim Kaufmann, patient will be scheduled as soon as possible.     Auth Submission: NO AUTH NEEDED Site of care: Site of care: [facility] Payer: Medicare A/B with AARP supplement Medication & CPT/J Code(s) submitted: Leqvio (Inclisiran) [phone] Route of submission (phone, fax, portal):  Phone # Fax # Auth type: Buy/Bill PB Units/visits requested: 284 x 2 doses Reference number:  Approval from: 03/27/23 to 07/19/23    Medicare A/B will cover 80%, AARP supplement will cover the remaining 20%.

## 2023-04-05 ENCOUNTER — Ambulatory Visit (INDEPENDENT_AMBULATORY_CARE_PROVIDER_SITE_OTHER): Payer: Medicare Other

## 2023-04-05 VITALS — BP 133/84 | HR 75 | Temp 97.9°F | Resp 18 | Ht 64.0 in | Wt 149.0 lb

## 2023-04-05 DIAGNOSIS — I2510 Atherosclerotic heart disease of native coronary artery without angina pectoris: Secondary | ICD-10-CM | POA: Diagnosis not present

## 2023-04-05 DIAGNOSIS — E785 Hyperlipidemia, unspecified: Secondary | ICD-10-CM | POA: Diagnosis not present

## 2023-04-05 DIAGNOSIS — I10 Essential (primary) hypertension: Secondary | ICD-10-CM

## 2023-04-05 MED ORDER — INCLISIRAN SODIUM 284 MG/1.5ML ~~LOC~~ SOSY
284.0000 mg | PREFILLED_SYRINGE | Freq: Once | SUBCUTANEOUS | Status: AC
  Administered 2023-04-05: 284 mg via SUBCUTANEOUS
  Filled 2023-04-05: qty 1.5

## 2023-04-05 NOTE — Progress Notes (Signed)
Diagnosis: Hyperlipidemia  Provider:  Chilton Greathouse MD  Procedure: Injection  Leqvio (inclisiran), Dose: 284 mg, Site: subcutaneous, Number of injections: 1  Post Care: Observation period completed  Discharge: Condition: Good, Destination: Home . AVS Provided  Performed by:  Loney Hering, LPN

## 2023-04-26 ENCOUNTER — Encounter: Payer: Self-pay | Admitting: Gastroenterology

## 2023-04-26 ENCOUNTER — Ambulatory Visit (INDEPENDENT_AMBULATORY_CARE_PROVIDER_SITE_OTHER): Payer: Medicare Other | Admitting: Gastroenterology

## 2023-04-26 VITALS — BP 126/70 | HR 67 | Ht 64.0 in | Wt 147.0 lb

## 2023-04-26 DIAGNOSIS — K2100 Gastro-esophageal reflux disease with esophagitis, without bleeding: Secondary | ICD-10-CM

## 2023-04-26 DIAGNOSIS — R1110 Vomiting, unspecified: Secondary | ICD-10-CM

## 2023-04-26 DIAGNOSIS — R059 Cough, unspecified: Secondary | ICD-10-CM

## 2023-04-26 DIAGNOSIS — Z8719 Personal history of other diseases of the digestive system: Secondary | ICD-10-CM

## 2023-04-26 DIAGNOSIS — R053 Chronic cough: Secondary | ICD-10-CM | POA: Diagnosis not present

## 2023-04-26 MED ORDER — FAMOTIDINE 40 MG PO TABS: 40.0000 mg | ORAL_TABLET | Freq: Two times a day (BID) | ORAL | 3 refills | Status: DC

## 2023-04-26 NOTE — Patient Instructions (Signed)
_______________________________________________________  If your blood pressure at your visit was 140/90 or greater, please contact your primary care physician to follow up on this. _______________________________________________________  If you are age 75 or older, your body mass index should be between 23-30. Your Body mass index is 25.23 kg/m. If this is out of the aforementioned range listed, please consider follow up with your Primary Care Provider. ________________________________________________________  The [facility] providers would like to encourage you to use [facility] to communicate with providers for non-urgent requests or questions.  Due to long hold times on the telephone, sending your provider a message by [facility] may be a faster and more efficient way to get a response.  Please allow 48 business hours for a response.  Please remember that this is for non-urgent requests.  _______________________________________________________  We have sent the following medications to your pharmacy for you to pick up at your convenience:  CONTINUE: famotidine 40mg  one tablet two times daily  You have been scheduled for an endoscopy. Please follow written instructions given to you at your visit today.  If you use inhalers (even only as needed), please bring them with you on the day of your procedure.  If you take any of the following medications, they will need to be adjusted prior to your procedure:   DO NOT TAKE 7 DAYS PRIOR TO TEST- Trulicity (dulaglutide) Ozempic, Wegovy (semaglutide) Mounjaro (tirzepatide) Bydureon Bcise (exanatide extended release)  DO NOT TAKE 1 DAY PRIOR TO YOUR TEST Rybelsus (semaglutide) Adlyxin (lixisenatide) Victoza (liraglutide) Byetta (exanatide) ___________________________________________________________________________   Due to recent changes in healthcare laws, you may see the results of your imaging and laboratory studies on MyChart before your  provider has had a chance to review them.  We understand that in some cases there may be results that are confusing or concerning to you. Not all laboratory results come back in the same time frame and the provider may be waiting for multiple results in order to interpret others.  Please give [place] 48 hours in order for your provider to thoroughly review all the results before contacting the office for clarification of your results.   It was a pleasure to see you today!  Vito Cirigliano, D.O.

## 2023-04-26 NOTE — Progress Notes (Signed)
Chief Complaint:    Cough  GI History: 77 year old female with a history of HTN, BCC, HLD, subdural hematoma after horse riding accident requiring surgical drainage 03/2021, follows in the GI clinic for the following:  1) GERD: Longstanding history of reflux. Index sxs infrequent HB, regurgitation, but most bothersome is chronic, dry cough. No dysphagia.  Previously responsive to Protonix 40 mg bid and Pepcid 40 mg qhs, along with avoidance of eating close to bedtime, sleeping with HOB elevated. -EGD (12/2019, Dr. Lemar Livings): LA Grade B esophagitis, 5 cm HH, normal stomach/duodenum -Barium swallow (07/19/2020): No stricture, moderate hiatal hernia, normal motility.  Significant inducible gastroesophageal reflux to the level of lower cervical spine.  Swallow 13 mm barium tablet without issue.  Unremarkable stomach/duodenum - EGD (08/2020, Dr. Barron Alvine): LA Grade B esophagitis, 5 cm HH, Hill grade 4 valve, normal stomach/duodenum - Laparoscopic hiatal hernia repair with concomitant TIF (11/03/2020) with 24 Serofuse fasteners placed - 11/17/2020: Follow-up in the GI clinic.  Complete resolution of reflux symptoms after cTIF and weaned off all acid suppression therapy  Additionally, she has a history of low-grade mucocele. - 12/2019: Colonoscopy: submucosal, nonobstructing mass at the appendiceal orifice.   - 01/2020: CT abdomen/pelvis: Appendiceal mucocele, large hiatal hernia, small hepatic cyst or hemangioma -03/2020: Appendectomy/cecectomy by Dr. Lemar Livings for 4.7 cm low-grade mucocele  HPI:     Patient is a 77 y.o. female presenting to the Gastroenterology Clinic for evaluation of cough and posttussive emesis.  Was doing well after cTIF, but sometime in 2023 developed a discomfort in her lower sternal area, described as "an annoyance but not pain and not heartburn".  She started treating with on-demand Tums and Gaviscon with some response.  No heartburn, regurgitation, globus sensation.  No  dysphagia.  Started developing postprandial cough which is now more bothersome.  Does have episodes of posttussive emesis.  Started taking famotidine 40 mg bid, and still uses Gaviscon at times.     Review of systems:     No chest pain, no SOB, no fevers, no urinary sx   Past Medical History:  Diagnosis Date   Arthritis    Cancer (HCC)    BASAL CELL SKIN CANCER   Essential hypertension 05/03/2014   GERD (gastroesophageal reflux disease)    History of hiatal hernia    LARGE   HTN (hypertension)    stress test 08-04-2007 EF 70%; low risk scan , no significant ischemia   Hyperlipemia    Murmur, cardiac    2d-echo on 09-08-2012 EF  55-60% normal study, mild LVH, no significant valve disease   Pneumonia     Patient's surgical history, family medical history, social history, medications and allergies were all reviewed in Epic    Current Outpatient Medications  Medication Sig Dispense Refill   Alpha-Lipoic Acid 600 MG CAPS Take 600 mg by mouth every evening.     Aspirin-Calcium Carbonate (BAYER WOMENS) 81-777 MG TABS Take 81 mg by mouth at bedtime.     B Complex Vitamins (VITAMIN-B COMPLEX PO) Take 1 tablet by mouth daily.      Beta Carotene (VITAMIN A) 25000 UNIT capsule Take 25,000 Units by mouth every Monday, Wednesday, and Friday.     Biotin 1 MG CAPS Take 1 mg by mouth daily in the afternoon.     Cholecalciferol (VITAMIN D3) 10 MCG (400 UNIT) CAPS Take 400 Units by mouth daily.     Coenzyme Q10 (COQ-10 PO) Take 300 mg by mouth daily.     Cyanocobalamin (  B-12 PO) Take 1,000 mcg by mouth daily.     famotidine (PEPCID) 40 MG tablet Take 40 mg by mouth 2 (two) times daily.     irbesartan-hydrochlorothiazide (AVALIDE) 300-12.5 MG tablet Take 1 tablet by mouth daily. Please schedule appointment for refills. (Patient taking differently: Take 1 tablet by mouth at bedtime.) 30 tablet 0   Magnesium 400 MG CAPS Take 400 mg by mouth 2 (two) times daily.     Multiple Vitamin (MULTIVITAMIN WITH  MINERALS) TABS tablet Take 1 tablet by mouth daily.     raloxifene (EVISTA) 60 MG tablet Take 60 mg by mouth every evening.      RESVERATROL PO Take 1 tablet by mouth daily. Vitamin supplement     rosuvastatin (CRESTOR) 40 MG tablet Take 1 tablet (40 mg total) by mouth daily. 90 tablet 3   tretinoin (RETIN-A) 0.1 % cream      vitamin C (ASCORBIC ACID) 500 MG tablet Take 500 mg by mouth daily.     vitamin E 180 MG (400 UNITS) capsule Take 400 Units by mouth daily.     ezetimibe (ZETIA) 10 MG tablet Take 1 tablet (10 mg total) by mouth daily. 90 tablet 3   famotidine (PEPCID) 40 MG tablet Take 40 mg by mouth daily. (Patient not taking: Reported on 11/14/2022)     No current facility-administered medications for this visit.    Physical Exam:     BP 126/70   Pulse 67   Ht 5\' 4"  (1.626 m)   Wt 147 lb (66.7 kg)   BMI 25.23 kg/m   GENERAL:  Pleasant female in NAD PSYCH: : Cooperative, normal affect Musculoskeletal:  Normal muscle tone, normal strength NEURO: Alert and oriented x 3, no focal neurologic deficits   IMPRESSION and PLAN:    1) Chronic cough 2) Posttussive emesis 3) History of GERD Discussed DDx for presenting symptoms at length today, to include possibility of breakthrough reflux despite previous concomitant laparoscopic hiatal hernia repair and TIF in 2022.  Had previously weaned off all acid suppression therapy, but now back on famotidine twice daily along with Gaviscon on demand.  - EGD to evaluate for wrap integrity, hernia recurrence, erosive esophagitis, etc. - Placed refill for Famotidine 40 mg PO BID -Additional recommendations pending endoscopic findings  The indications, risks, and benefits of EGD were explained to the patient in detail. Risks include but are not limited to bleeding, perforation, adverse reaction to medications, and cardiopulmonary compromise. Sequelae include but are not limited to the possibility of surgery, hospitalization, and mortality. The  patient verbalized understanding and wished to proceed. All questions answered, referred to scheduler. Further recommendations pending results of the exam.            Shellia Cleverly ,DO, FACG 04/26/2023, 9:44 AM

## 2023-04-29 ENCOUNTER — Other Ambulatory Visit: Payer: Self-pay

## 2023-04-29 MED ORDER — ROSUVASTATIN CALCIUM 40 MG PO TABS: 40.0000 mg | ORAL_TABLET | Freq: Every day | ORAL | 1 refills | Status: DC

## 2023-05-01 ENCOUNTER — Other Ambulatory Visit: Payer: Self-pay

## 2023-05-01 MED ORDER — ROSUVASTATIN CALCIUM 40 MG PO TABS: 40.0000 mg | ORAL_TABLET | Freq: Every day | ORAL | 1 refills | Status: DC

## 2023-05-02 ENCOUNTER — Other Ambulatory Visit: Payer: Self-pay | Admitting: Cardiovascular Disease

## 2023-05-02 MED ORDER — ROSUVASTATIN CALCIUM 40 MG PO TABS: 40.0000 mg | ORAL_TABLET | Freq: Every day | ORAL | 1 refills | Status: DC

## 2023-05-07 ENCOUNTER — Encounter: Payer: Self-pay | Admitting: Gastroenterology

## 2023-05-07 ENCOUNTER — Ambulatory Visit: Payer: Medicare Other | Admitting: Gastroenterology

## 2023-05-07 ENCOUNTER — Telehealth: Payer: Self-pay | Admitting: Gastroenterology

## 2023-05-07 VITALS — BP 121/77 | HR 60 | Temp 97.2°F | Resp 12 | Ht 64.0 in | Wt 147.0 lb

## 2023-05-07 DIAGNOSIS — K3189 Other diseases of stomach and duodenum: Secondary | ICD-10-CM | POA: Diagnosis not present

## 2023-05-07 DIAGNOSIS — R053 Chronic cough: Secondary | ICD-10-CM | POA: Diagnosis not present

## 2023-05-07 DIAGNOSIS — R059 Cough, unspecified: Secondary | ICD-10-CM

## 2023-05-07 DIAGNOSIS — K2950 Unspecified chronic gastritis without bleeding: Secondary | ICD-10-CM

## 2023-05-07 DIAGNOSIS — K2100 Gastro-esophageal reflux disease with esophagitis, without bleeding: Secondary | ICD-10-CM

## 2023-05-07 DIAGNOSIS — R142 Eructation: Secondary | ICD-10-CM

## 2023-05-07 DIAGNOSIS — K449 Diaphragmatic hernia without obstruction or gangrene: Secondary | ICD-10-CM

## 2023-05-07 DIAGNOSIS — Z9889 Other specified postprocedural states: Secondary | ICD-10-CM

## 2023-05-07 MED ORDER — PANTOPRAZOLE SODIUM 40 MG PO TBEC: 40.0000 mg | DELAYED_RELEASE_TABLET | Freq: Two times a day (BID) | ORAL | 1 refills | Status: DC

## 2023-05-07 MED ORDER — SODIUM CHLORIDE 0.9 % IV SOLN: 500.0000 mL | Freq: Once | INTRAVENOUS | Status: DC

## 2023-05-07 NOTE — Progress Notes (Signed)
GASTROENTEROLOGY PROCEDURE H&P NOTE   Primary Care Physician: Carylon Perches, MD    Reason for Procedure:   Chronic cough, history of fundoplication, posttussive emesis  Plan:    EGD  Patient is appropriate for endoscopic procedure(s) in the ambulatory ([facility]) setting.  The nature of the procedure, as well as the risks, benefits, and alternatives were carefully and thoroughly reviewed with the patient. Ample time for discussion and questions allowed. The patient understood, was satisfied, and agreed to proceed.     HPI: Amy Cannon is a 77 y.o. female who presents for EGD for evaluation of chronic cough, posttussive emesis, and history of GERD s/p cTIF in 10/2020 .  Patient was most recently seen in the Gastroenterology Clinic on 04/26/2023 with me. Currently on famotidine 40 mg BID.  No interval change in medical history since that appointment. Please refer to that note for full details regarding GI history and clinical presentation.   Past Medical History:  Diagnosis Date   Arthritis    Cancer (HCC)    BASAL CELL SKIN CANCER   Essential hypertension 05/03/2014   GERD (gastroesophageal reflux disease)    History of hiatal hernia    LARGE   HTN (hypertension)    stress test 08-04-2007 EF 70%; low risk scan , no significant ischemia   Hyperlipemia    Murmur, cardiac    2d-echo on 09-08-2012 EF  55-60% normal study, mild LVH, no significant valve disease   Pneumonia     Past Surgical History:  Procedure Laterality Date   BURR HOLE Right 04/05/2021   Procedure: Right sided BURR HOLE evacuation of subdural hematoma;  Surgeon: Julio Sicks, MD;  Location: [facility] OR;  Service: Neurosurgery;  Laterality: Right;   CATARACT EXTRACTION W/ INTRAOCULAR LENS IMPLANT Bilateral    COLONOSCOPY     COLONOSCOPY WITH PROPOFOL N/A 01/13/2020   Procedure: COLONOSCOPY WITH PROPOFOL;  Surgeon: Earline Mayotte, MD;  Location: [facility];  Service: Endoscopy;  Laterality: N/A;   complex repair  wrist/hand/finger     ESOPHAGOGASTRODUODENOSCOPY     ESOPHAGOGASTRODUODENOSCOPY N/A 11/03/2020   Procedure: ESOPHAGOGASTRODUODENOSCOPY (EGD);  Surgeon: Shellia Cleverly, DO;  Location: [facility];  Service: Gastroenterology;  Laterality: N/A;   ESOPHAGOGASTRODUODENOSCOPY (EGD) WITH PROPOFOL N/A 01/13/2020   Procedure: ESOPHAGOGASTRODUODENOSCOPY (EGD) WITH PROPOFOL;  Surgeon: Earline Mayotte, MD;  Location: [facility];  Service: Endoscopy;  Laterality: N/A;   HIATAL HERNIA REPAIR N/A 11/03/2020   Procedure: LAPAROSCOPIC REPAIR OF HIATAL HERNIA;  Surgeon: Gaynelle Adu, MD;  Location: [facility];  Service: General;  Laterality: N/A;   LAPAROSCOPIC APPENDECTOMY N/A 04/01/2020   Procedure: APPENDECTOMY LAPAROSCOPIC;  Surgeon: Earline Mayotte, MD;  Location: [facility];  Service: General;  Laterality: N/A;   MINOR IRRIGATION AND DEBRIDEMENT OF WOUND Left    TONSILLECTOMY     TRANSORAL INCISIONLESS FUNDOPLICATION N/A 11/03/2020   Procedure: TRANSORAL INCISIONLESS FUNDOPLICATION;  Surgeon: Shellia Cleverly, DO;  Location: [facility];  Service: Gastroenterology;  Laterality: N/A;    Prior to Admission medications   Medication Sig Start Date End Date Taking? Authorizing Provider  Alpha-Lipoic Acid 600 MG CAPS Take 600 mg by mouth every evening.   Yes [provider]  Aspirin-Calcium Carbonate (BAYER WOMENS) [phone] MG TABS Take 81 mg by mouth at bedtime. 05/02/18  Yes [provider]  B Complex Vitamins (VITAMIN-B COMPLEX PO) Take 1 tablet by mouth daily.    Yes [provider]  Beta Carotene (VITAMIN A) 25000 UNIT capsule Take 25,000 Units by  mouth every Monday, Wednesday, and Friday.   Yes [provider]  Biotin 1 MG CAPS Take 1 mg by mouth daily in the afternoon. 07/18/21  Yes [provider]  Cholecalciferol (VITAMIN D3) 10 MCG (400 UNIT) CAPS Take 400 Units by mouth daily.   Yes [provider]  Coenzyme Q10 (COQ-10 PO) Take 300 mg by mouth daily.    Yes [provider]  Cyanocobalamin (B-12 PO) Take 1,000 mcg by mouth daily.   Yes [provider]  ezetimibe (ZETIA) 10 MG tablet Take 1 tablet (10 mg total) by mouth daily. 01/08/23 05/07/23 Yes Croitoru, Mihai, MD  famotidine (PEPCID) 40 MG tablet Take 1 tablet (40 mg total) by mouth 2 (two) times daily. 04/26/23  Yes Alajiah Dutkiewicz V, DO  irbesartan-hydrochlorothiazide (AVALIDE) 300-12.5 MG tablet Take 1 tablet by mouth daily. Please schedule appointment for refills. Patient taking differently: Take 1 tablet by mouth at bedtime. 10/28/15  Yes Croitoru, Mihai, MD  LOTEMAX 0.5 % OINT SMARTSIG:sparingly In Eye(s) Daily 04/04/23  Yes [provider]  Magnesium 400 MG CAPS Take 400 mg by mouth 2 (two) times daily.   Yes [provider]  Multiple Vitamin (MULTIVITAMIN WITH MINERALS) TABS tablet Take 1 tablet by mouth daily.   Yes [provider]  raloxifene (EVISTA) 60 MG tablet Take 60 mg by mouth every evening.    Yes [provider]  RESVERATROL PO Take 1 tablet by mouth daily. Vitamin supplement   Yes [provider]  rosuvastatin (CRESTOR) 40 MG tablet Take 1 tablet (40 mg total) by mouth daily. 05/02/23  Yes Croitoru, Mihai, MD  tretinoin (RETIN-A) 0.1 % cream  07/18/21  Yes [provider]  vitamin C (ASCORBIC ACID) 500 MG tablet Take 500 mg by mouth daily.   Yes [provider]  vitamin E 180 MG (400 UNITS) capsule Take 400 Units by mouth daily.   Yes [provider]    Current Outpatient Medications  Medication Sig Dispense Refill   Alpha-Lipoic Acid 600 MG CAPS Take 600 mg by mouth every evening.     Aspirin-Calcium Carbonate (BAYER WOMENS) 81-777 MG TABS Take 81 mg by mouth at bedtime.     B Complex Vitamins (VITAMIN-B COMPLEX PO) Take 1 tablet by mouth daily.      Beta Carotene (VITAMIN A) 25000 UNIT capsule Take 25,000 Units by mouth every Monday, Wednesday, and Friday.     Biotin 1 MG CAPS Take  1 mg by mouth daily in the afternoon.     Cholecalciferol (VITAMIN D3) 10 MCG (400 UNIT) CAPS Take 400 Units by mouth daily.     Coenzyme Q10 (COQ-10 PO) Take 300 mg by mouth daily.     Cyanocobalamin (B-12 PO) Take 1,000 mcg by mouth daily.     ezetimibe (ZETIA) 10 MG tablet Take 1 tablet (10 mg total) by mouth daily. 90 tablet 3   famotidine (PEPCID) 40 MG tablet Take 1 tablet (40 mg total) by mouth 2 (two) times daily. 180 tablet 3   irbesartan-hydrochlorothiazide (AVALIDE) 300-12.5 MG tablet Take 1 tablet by mouth daily. Please schedule appointment for refills. (Patient taking differently: Take 1 tablet by mouth at bedtime.) 30 tablet 0   LOTEMAX 0.5 % OINT SMARTSIG:sparingly In Eye(s) Daily     Magnesium 400 MG CAPS Take 400 mg by mouth 2 (two) times daily.     Multiple Vitamin (MULTIVITAMIN WITH MINERALS) TABS tablet Take 1 tablet by mouth daily.     raloxifene (EVISTA)  60 MG tablet Take 60 mg by mouth every evening.      RESVERATROL PO Take 1 tablet by mouth daily. Vitamin supplement     rosuvastatin (CRESTOR) 40 MG tablet Take 1 tablet (40 mg total) by mouth daily. 90 tablet 1   tretinoin (RETIN-A) 0.1 % cream      vitamin C (ASCORBIC ACID) 500 MG tablet Take 500 mg by mouth daily.     vitamin E 180 MG (400 UNITS) capsule Take 400 Units by mouth daily.     Current Facility-Administered Medications  Medication Dose Route Frequency Provider Last Rate Last Admin   0.9 %  sodium chloride infusion  500 mL Intravenous Once Jaselle Pryer V, DO        Allergies as of 05/07/2023 - Review Complete 05/07/2023  Allergen Reaction Noted   Lisinopril Cough 04/22/2017    Family History  Problem Relation Age of Onset   CAD Other    Cancer Mother        appendectomy and spreaded to colon   Colon cancer Mother    Esophageal cancer Neg Hx    Rectal cancer Neg Hx    Stomach cancer Neg Hx     Social History   Socioeconomic History   Marital status: Widowed    Spouse name: Not on file    Number of children: Not on file   Years of education: Not on file   Highest education level: Not on file  Occupational History   Not on file  Tobacco Use   Smoking status: Former   Smokeless tobacco: Never  Vaping Use   Vaping status: Never Used  Substance and Sexual Activity   Alcohol use: Yes    Alcohol/week: 5.0 standard drinks of alcohol    Types: 5 Standard drinks or equivalent per week    Comment: SOCIALLY   Drug use: No   Sexual activity: Not on file  Other Topics Concern   Not on file  Social History Narrative   Not on file   Social Drivers of Health   Financial Resource Strain: Not on file  Food Insecurity: Not on file  Transportation Needs: Not on file  Physical Activity: Not on file  Stress: Not on file  Social Connections: Not on file  Intimate Partner Violence: Not on file    Physical Exam: Vital signs in last 24 hours: @BP  (!) 127/91   Pulse 69   Temp (!) 97.2 F (36.2 C) (Temporal)   Ht 5\' 4"  (1.626 m)   Wt 147 lb (66.7 kg)   SpO2 94%   BMI 25.23 kg/m  GEN: NAD EYE: Sclerae anicteric ENT: MMM CV: Non-tachycardic Pulm: CTA b/l GI: Soft, NT/ND NEURO:  Alert & Oriented x 3   Doristine Locks, DO [facility]   05/07/2023 9:56 AM

## 2023-05-07 NOTE — Progress Notes (Signed)
Vss nad trans to pacu 

## 2023-05-07 NOTE — Progress Notes (Signed)
Called to room to assist during endoscopic procedure.  Patient ID and intended procedure confirmed with present staff. Received instructions for my participation in the procedure from the performing physician.  

## 2023-05-07 NOTE — Progress Notes (Signed)
Pt's states no medical or surgical changes since previsit or office visit. 

## 2023-05-07 NOTE — Telephone Encounter (Signed)
Patient called and stated she is needing a refill of the pantoprazole 40 MG. Patient also stated that she is wishing that prescription go to [facility]. Please advise.

## 2023-05-07 NOTE — Op Note (Signed)
[facility] Patient Name: Amy Cannon Procedure Date: 05/07/2023 10:00 AM MRN: [id number] Endoscopist: Doristine Locks , MD, [id number] Age: 77 Referring MD:  Date of Birth: 1945/09/25 Gender: Female Account #: [id number] Procedure:                Upper GI endoscopy Indications:              Chronic cough, Eructation Medicines:                Monitored Anesthesia Care Procedure:                Pre-Anesthesia Assessment:                           - Prior to the procedure, a History and Physical                            was performed, and patient medications and                            allergies were reviewed. The patient's tolerance of                            previous anesthesia was also reviewed. The risks                            and benefits of the procedure and the sedation                            options and risks were discussed with the patient.                            All questions were answered, and informed consent                            was obtained. Prior Anticoagulants: The patient has                            taken no anticoagulant or antiplatelet agents. ASA                            Grade Assessment: II - A patient with mild systemic                            disease. After reviewing the risks and benefits,                            the patient was deemed in satisfactory condition to                            undergo the procedure.                           After obtaining informed consent, the endoscope was  passed under direct vision. Throughout the                            procedure, the patient's blood pressure, pulse, and                            oxygen saturations were monitored continuously. The                            Olympus Scope [id number] was introduced through the                            mouth, and advanced to the second part of duodenum.                            The upper GI  endoscopy was accomplished without                            difficulty. The patient tolerated the procedure                            well. Scope In: Scope Out: Findings:                 The upper third of the esophagus and middle third                            of the esophagus were normal.                           LA Grade B (one or more mucosal breaks greater than                            5 mm, not extending between the tops of two mucosal                            folds) esophagitis with no bleeding was found in                            the lower third of the esophagus.                           A 3 cm hiatal hernia was present.                           Evidence of a transoral incisionless fundoplication                            (TIF) was found in the cardia. The wrap appeared                            loose. This was traversed.  Mild inflammation characterized by congestion                            (edema) and erythema was found in the gastric                            fundus and in the gastric body. Biopsies were taken                            with a cold forceps for Helicobacter pylori                            testing. Estimated blood loss was minimal.                           The incisura, gastric antrum and pylorus were                            normal.                           The examined duodenum was normal. Complications:            No immediate complications. Estimated Blood Loss:     Estimated blood loss was minimal. Impression:               - Normal upper third of esophagus and middle third                            of esophagus.                           - LA Grade B reflux esophagitis with no bleeding.                           - 3 cm hiatal hernia.                           - A transoral incisionless fundoplication (TIF) was                            found. The wrap appears loose.                           -  Gastritis. Biopsied.                           - Normal incisura, antrum and pylorus.                           - Normal examined duodenum.                           - There has been an interval return of hiatal  hernia and the wrap appears no longer intact. Will                            plan to restart high dose PPI therapy to promote                            mucosal healing and therapeutic intent. We can                            discuss the possibility and options of repeat                            surgical intervention at outpatient follow-up. Recommendation:           - Patient has a contact number available for                            emergencies. The signs and symptoms of potential                            delayed complications were discussed with the                            patient. Return to normal activities tomorrow.                            Written discharge instructions were provided to the                            patient.                           - Resume previous diet.                           - Await pathology results.                           - Use Protonix (pantoprazole) 40 mg PO BID for 8                            weeks, then reduce to 40 mg daily.                           - Return to GI clinic with me at the next available                            appointment. Doristine Locks, MD 05/07/2023 10:34:44 AM

## 2023-05-07 NOTE — Patient Instructions (Signed)
Please read handouts provided. Resume previous diet. Await pathology results. Use Protonix ( pantoprazole ) 40 mg twice daily for 8 weeks, then reduce to 40 mg daily. Return to GI clinic with Dr. Barron Alvine at next available appointment.   YOU HAD AN ENDOSCOPIC PROCEDURE TODAY AT THE [facility]:   Refer to the procedure report that was given to you for any specific questions about what was found during the examination.  If the procedure report does not answer your questions, please call your gastroenterologist to clarify.  If you requested that your care partner not be given the details of your procedure findings, then the procedure report has been included in a sealed envelope for you to review at your convenience later.  YOU SHOULD EXPECT: Some feelings of bloating in the abdomen. Passage of more gas than usual.  Walking can help get rid of the air that was put into your GI tract during the procedure and reduce the bloating. If you had a lower endoscopy (such as a colonoscopy or flexible sigmoidoscopy) you may notice spotting of blood in your stool or on the toilet paper. If you underwent a bowel prep for your procedure, you may not have a normal bowel movement for a few days.  Please Note:  You might notice some irritation and congestion in your nose or some drainage.  This is from the oxygen used during your procedure.  There is no need for concern and it should clear up in a day or so.  SYMPTOMS TO REPORT IMMEDIATELY:  Following upper endoscopy (EGD)  Vomiting of blood or coffee ground material  New chest pain or pain under the shoulder blades  Painful or persistently difficult swallowing  New shortness of breath  Fever of 100F or higher  Black, tarry-looking stools  For urgent or emergent issues, a gastroenterologist can be reached at any hour by calling [phone]. Do not use MyChart messaging for urgent concerns.    DIET:  We do recommend a small meal at first,  but then you may proceed to your regular diet.  Drink plenty of fluids but you should avoid alcoholic beverages for 24 hours.  ACTIVITY:  You should plan to take it easy for the rest of today and you should NOT DRIVE or use heavy machinery until tomorrow (because of the sedation medicines used during the test).    FOLLOW UP: Our staff will call the number listed on your records the next business day following your procedure.  We will call around 7:15- 8:00 am to check on you and address any questions or concerns that you may have regarding the information given to you following your procedure. If we do not reach you, we will leave a message.     If any biopsies were taken you will be contacted by phone or by letter within the next 1-3 weeks.  Please call us at [phone] if you have not heard about the biopsies in 3 weeks.    SIGNATURES/CONFIDENTIALITY: You and/or your care partner have signed paperwork which will be entered into your electronic medical record.  These signatures attest to the fact that that the information above on your After Visit Summary has been reviewed and is understood.  Full responsibility of the confidentiality of this discharge information lies with you and/or your care-partner.

## 2023-05-07 NOTE — Telephone Encounter (Signed)
Rx sent to [facility]  

## 2023-05-08 ENCOUNTER — Telehealth: Payer: Self-pay

## 2023-05-08 ENCOUNTER — Other Ambulatory Visit: Payer: Self-pay | Admitting: *Deleted

## 2023-05-08 ENCOUNTER — Encounter: Payer: Self-pay | Admitting: Gastroenterology

## 2023-05-08 DIAGNOSIS — K449 Diaphragmatic hernia without obstruction or gangrene: Secondary | ICD-10-CM

## 2023-05-08 DIAGNOSIS — K2100 Gastro-esophageal reflux disease with esophagitis, without bleeding: Secondary | ICD-10-CM

## 2023-05-08 DIAGNOSIS — R142 Eructation: Secondary | ICD-10-CM

## 2023-05-08 NOTE — Telephone Encounter (Signed)
  Follow up Call-     05/07/2023    9:11 AM 09/13/2020    2:50 PM  Call back number  Post procedure Call Back phone  # [phone]  Permission to leave phone message Yes Yes     Patient questions:  Do you have a fever, pain , or abdominal swelling? No. Pain Score  0 *  Have you tolerated food without any problems? Yes.    Have you been able to return to your normal activities? Yes.    Do you have any questions about your discharge instructions: Diet   No. Medications  No. Follow up visit  No.  Do you have questions or concerns about your Care? No.  Actions: * If pain score is 4 or above: No action needed, pain <4.

## 2023-05-08 NOTE — Telephone Encounter (Signed)
===  View-only below this line=== ----- Message ----- From: Shellia Cleverly, DO Sent: 05/08/2023  12:28 PM EST To: Richardson Chiquito, RN  Can you let Jeyla know that I spoke with Dr. Andrey Campanile with the following plan:  - Barium esophagram for preoperative evaluation - Esophageal manometry needs to be done per Dr. Andrey Campanile as this is a redo surgery.  Can we please coordinate to have that done at [facility] - Keep follow-up appoint with me as scheduled for now, and we can expedite as needed based on these studies - Will start looking for dates for the OR for a concomitant robotic assisted hiatal hernia repair and TIF.  Can start looking for dates that work with her travel schedule.

## 2023-05-08 NOTE — Telephone Encounter (Signed)
Dr C,   I have scheduled patient for barium esophagram at [facility] on 05/23/23 at 10 am, 945 am arrival.  Unfortunately, the fist esophageal manometry availability is 09/25/23 at 830 am. I have scheduled this date but wanted to make you aware of the long wait time before I reach back out to the patient.

## 2023-05-09 LAB — SURGICAL PATHOLOGY

## 2023-05-09 NOTE — Telephone Encounter (Addendum)
Left message for patient to call back.  25 pages of referral information/records faxed to [facility] for esophageal manometry.

## 2023-05-10 NOTE — Telephone Encounter (Signed)
Spoke to patient to advise of recommendations per Dr Barron Alvine and Dr Cliffton Asters. Patient is advised of scheduled barium esophagram, date/time/prep for testing. Also advised that she has been scheduled for the next available esophageal manometry at our hospital on 09/25/23. Due to the long wait time, I have advised that we have also sent a referral over to [facility] for possible sooner manometry so we can look to get surgery scheduled  at a time convenient for her. Patient verbalizes understanding. Should we not hear back from [facility] with sooner manometry in the next week or so, than she would like to move barium esophagram to a date closer to manometry to have all testing completed around them same timeframe.

## 2023-05-21 NOTE — Telephone Encounter (Signed)
Spoke with patient. Since we have not yet heard from [facility] about a sooner manometry, she would like to hold off on barium swallow until closer to the date of her scheduled esophageal manometry on 09/25/23. Barium swallow cancelled for now.

## 2023-05-23 ENCOUNTER — Other Ambulatory Visit (HOSPITAL_COMMUNITY): Payer: Medicare Other

## 2023-06-12 NOTE — Addendum Note (Signed)
Addended by: Richardson Chiquito on: 06/12/2023 09:55 AM   Modules accepted: Orders

## 2023-06-12 NOTE — Telephone Encounter (Signed)
Patient has been advised that we had a cancellation from another patient for esophageal manometry on 06/19/23. Patient states that she would like to move her currently scheduled 09/25/23 esophageal manometry to the 06/19/23 opening. This has been done and patient's manometry is now on 06/19/23 at 1215 pm. Esophageal manometry prep instructions have been made available via mychart for patient.

## 2023-06-19 ENCOUNTER — Encounter (HOSPITAL_COMMUNITY): Payer: Self-pay | Admitting: Gastroenterology

## 2023-06-19 ENCOUNTER — Encounter (HOSPITAL_COMMUNITY)
Admission: RE | Disposition: A | Discharge status: Home / Self Care | Payer: Self-pay | Source: Ambulatory Visit | Attending: Gastroenterology

## 2023-06-19 ENCOUNTER — Ambulatory Visit (HOSPITAL_COMMUNITY)
Admission: RE | Admit: 2023-06-19 | Discharge: 2023-06-19 | Disposition: A | Discharge status: Home / Self Care | Payer: Medicare Other | Source: Ambulatory Visit | Attending: Gastroenterology | Admitting: Gastroenterology

## 2023-06-19 DIAGNOSIS — K219 Gastro-esophageal reflux disease without esophagitis: Secondary | ICD-10-CM

## 2023-06-19 DIAGNOSIS — K449 Diaphragmatic hernia without obstruction or gangrene: Secondary | ICD-10-CM | POA: Insufficient documentation

## 2023-06-19 DIAGNOSIS — R053 Chronic cough: Secondary | ICD-10-CM

## 2023-06-19 HISTORY — PX: ESOPHAGEAL MANOMETRY: SHX5429

## 2023-06-19 SURGERY — MANOMETRY, ESOPHAGUS
Anesthesia: Choice

## 2023-06-19 MED ORDER — LIDOCAINE VISCOUS HCL 2 % MT SOLN
OROMUCOSAL | Status: AC
  Filled 2023-06-19: qty 15

## 2023-06-19 SURGICAL SUPPLY — 2 items
FACESHIELD LNG OPTICON STERILE (SAFETY) IMPLANT
GLOVE BIO SURGEON STRL SZ8 (GLOVE) ×2 IMPLANT

## 2023-06-19 NOTE — Progress Notes (Signed)
Esophageal manometry performed per protocol.  Patient tolerated well.

## 2023-06-20 ENCOUNTER — Encounter (HOSPITAL_COMMUNITY): Payer: Self-pay | Admitting: Gastroenterology

## 2023-06-28 ENCOUNTER — Ambulatory Visit (HOSPITAL_COMMUNITY)
Admission: RE | Admit: 2023-06-28 | Discharge: 2023-06-28 | Disposition: A | Discharge status: Home / Self Care | Payer: Medicare Other | Source: Ambulatory Visit | Attending: Gastroenterology

## 2023-06-28 DIAGNOSIS — K449 Diaphragmatic hernia without obstruction or gangrene: Secondary | ICD-10-CM | POA: Diagnosis present

## 2023-06-28 DIAGNOSIS — K2100 Gastro-esophageal reflux disease with esophagitis, without bleeding: Secondary | ICD-10-CM | POA: Diagnosis present

## 2023-06-28 DIAGNOSIS — R142 Eructation: Secondary | ICD-10-CM | POA: Diagnosis present

## 2023-07-01 ENCOUNTER — Telehealth: Payer: Self-pay | Admitting: Gastroenterology

## 2023-07-01 NOTE — Telephone Encounter (Signed)
 Patient wants Dr Karene Oto to be aware that in order for her to have cTIF completed again, it would have to be completed between 08/18/23-10/14/23 due to previous obligations for out of town trips. States if not within this time frame, she will need to wait until after July to get this done since she is going to [place] at that time.

## 2023-07-01 NOTE — Telephone Encounter (Signed)
 Inbound call from patient requesting a call to discuss rescheduling TIF procedure. Please advise, thank you.

## 2023-07-05 ENCOUNTER — Telehealth: Payer: Self-pay

## 2023-07-05 NOTE — Telephone Encounter (Signed)
Auth Submission: NO AUTH NEEDED Site of care: Site of care: [facility] Payer: Medicare A/B with AARP supplement Medication & CPT/J Code(s) submitted: Leqvio (Inclisiran) [phone] Route of submission (phone, fax, portal):  Phone # Fax # Auth type: Buy/Bill PB Units/visits requested: 284 x 2 doses Reference number:  Approval from: 03/27/23 to 06/20/24    Medicare A/B will cover 80%, AARP supplement will cover the remaining 20%.

## 2023-07-08 ENCOUNTER — Encounter: Payer: Self-pay | Admitting: Gastroenterology

## 2023-07-08 NOTE — Telephone Encounter (Signed)
I have sent a note to Sheralyn Boatman and Jack Quarto, schedulers for Dr Andrey Campanile at [facility] to try to coordinate a date for cTIF within this time frame. Awaiting response.Marland KitchenMarland Kitchen

## 2023-07-12 ENCOUNTER — Ambulatory Visit: Payer: Medicare Other

## 2023-07-12 VITALS — BP 128/85 | HR 74 | Temp 97.7°F | Resp 16 | Ht 65.0 in | Wt 151.2 lb

## 2023-07-12 DIAGNOSIS — I2510 Atherosclerotic heart disease of native coronary artery without angina pectoris: Secondary | ICD-10-CM | POA: Diagnosis not present

## 2023-07-12 DIAGNOSIS — E785 Hyperlipidemia, unspecified: Secondary | ICD-10-CM

## 2023-07-12 DIAGNOSIS — I10 Essential (primary) hypertension: Secondary | ICD-10-CM | POA: Diagnosis not present

## 2023-07-12 MED ORDER — INCLISIRAN SODIUM 284 MG/1.5ML ~~LOC~~ SOSY
284.0000 mg | PREFILLED_SYRINGE | Freq: Once | SUBCUTANEOUS | Status: AC
  Administered 2023-07-12: 284 mg via SUBCUTANEOUS
  Filled 2023-07-12: qty 1.5

## 2023-07-12 NOTE — Progress Notes (Signed)
 Diagnosis: Hyperlipidemia  Provider:  Chilton Greathouse MD  Procedure: Injection  Leqvio (inclisiran), Dose: 284 mg, Site: subcutaneous, Number of injections: 1  Injection Site(s): Right arm  Post Care: Observation period completed  Discharge: Condition: Good, Destination: Home . AVS Declined  Performed by:  Forrest Moron, RN

## 2023-07-16 ENCOUNTER — Encounter: Payer: Self-pay | Admitting: Cardiovascular Disease

## 2023-07-16 ENCOUNTER — Ambulatory Visit: Payer: Medicare Other | Attending: Cardiovascular Disease | Admitting: Cardiovascular Disease

## 2023-07-16 VITALS — BP 128/78 | HR 62 | Ht 65.0 in | Wt 148.4 lb

## 2023-07-16 DIAGNOSIS — E7841 Elevated Lipoprotein(a): Secondary | ICD-10-CM | POA: Diagnosis not present

## 2023-07-16 DIAGNOSIS — I10 Essential (primary) hypertension: Secondary | ICD-10-CM | POA: Insufficient documentation

## 2023-07-16 DIAGNOSIS — I77810 Thoracic aortic ectasia: Secondary | ICD-10-CM | POA: Insufficient documentation

## 2023-07-16 DIAGNOSIS — K2100 Gastro-esophageal reflux disease with esophagitis, without bleeding: Secondary | ICD-10-CM | POA: Diagnosis present

## 2023-07-16 DIAGNOSIS — K449 Diaphragmatic hernia without obstruction or gangrene: Secondary | ICD-10-CM | POA: Diagnosis present

## 2023-07-16 DIAGNOSIS — E7801 Familial hypercholesterolemia: Secondary | ICD-10-CM | POA: Insufficient documentation

## 2023-07-16 MED ORDER — ROSUVASTATIN CALCIUM 20 MG PO TABS: 20.0000 mg | ORAL_TABLET | Freq: Every day | ORAL | 3 refills | Status: DC

## 2023-07-16 NOTE — Patient Instructions (Addendum)
 Medication Instructions:  Decrease Crestor to 20 mg ( Take 1 Tablet Daily). *If you need a refill on your cardiac medications before your next appointment, please call your pharmacy*   Lab Work: Lipid Panel, Lipoprotein A: To Be Done In April 2025 If you have labs (blood work) drawn today and your tests are completely normal, you will receive your results only by: MyChart Message (if you have MyChart) OR A paper copy in the mail If you have any lab test that is abnormal or we need to change your treatment, we will call you to review the results.   Testing/Procedures: No Testing   Follow-Up: At [facility], you and your health needs are our priority.  As part of our continuing mission to provide you with exceptional heart care, we have created designated Provider Care Teams.  These Care Teams include your primary Cardiologist (physician) and Advanced Practice Providers (APPs -  Physician Assistants and Nurse Practitioners) who all work together to provide you with the care you need, when you need it.  We recommend signing up for the patient portal called "MyChart".  Sign up information is provided on this After Visit Summary.  MyChart is used to connect with patients for Virtual Visits (Telemedicine).  Patients are able to view lab/test results, encounter notes, upcoming appointments, etc.  Non-urgent messages can be sent to your provider as well.   To learn more about what you can do with MyChart, go to [email or web address].    Your next appointment:   1 year(s)  Provider:   Thurmon Fair, MD

## 2023-07-16 NOTE — Progress Notes (Signed)
 Cardiology Office Note:    Date:  07/16/2023   ID:  Amy Cannon, DOB 1946-01-18, MRN [id number]  PCP:  Carylon Perches, MD   [facility]  Cardiologist:  Thurmon Fair, MD Demond Shallenberger Advanced Practice Provider:  No care team member to display Electrophysiologist:  None       Referring MD: Carylon Perches, MD   Chief Complaint  Patient presents with   Hyperlipidemia    History of Present Illness:    Amy Cannon is a 78 y.o. 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  She had a serious horse riding accident complicated by subdural hematoma about 3 years ago and has recovered completely from it.  Unfortunately her hiatal hernia repair popped loose, probably following that accident and she will need repeat surgery, sometime this year.  She is riding her horse again.  She is planning a trip to [place] in [place] in the summer.  The patient specifically denies any chest pain at rest or with exertion, dyspnea at rest or with exertion, orthopnea, paroxysmal nocturnal dyspnea, syncope, palpitations, focal neurological deficits, intermittent claudication, lower extremity edema, unexplained weight gain, cough, hemoptysis or wheezing.   Although maximum dose rosuvastatin plus ezetimibe provided a significant reduction in her LDL cholesterol, she was still well above target until she started treatment with Leqvio.  After the first dose of Leqvio her total cholesterol is 127, HDL 77, triglycerides 77, calculated LDL 34.  She does not have diabetes mellitus and does not smoke and has normal thyroid function.  She has had to start taking PPI again due to her GERD symptoms that recurred after a  breakdown of the hiatal hernia repair.  She is worried about taking these long-term due to osteopenia.  Past Medical History:  Diagnosis Date   Arthritis    Cancer (HCC)    BASAL CELL SKIN CANCER   Essential hypertension 05/03/2014   GERD (gastroesophageal reflux disease)    History of hiatal hernia    LARGE   HTN (hypertension)    stress test 08-04-2007 EF 70%; low risk scan , no significant ischemia   Hyperlipemia    Murmur, cardiac    2d-echo on 09-08-2012 EF  55-60% normal study, mild LVH, no significant valve disease   Pneumonia     Past Surgical History:  Procedure Laterality Date   BURR HOLE Right 04/05/2021   Procedure: Right sided BURR HOLE evacuation of subdural hematoma;  Surgeon: Julio Sicks, MD;  Location: [facility];  Service: Neurosurgery;  Laterality: Right;   CATARACT EXTRACTION W/ INTRAOCULAR LENS IMPLANT Bilateral    COLONOSCOPY     COLONOSCOPY WITH PROPOFOL N/A 01/13/2020   Procedure: COLONOSCOPY WITH PROPOFOL;  Surgeon: Earline Mayotte, MD;  Location: [facility];  Service: Endoscopy;  Laterality: N/A;   complex repair wrist/hand/finger     ESOPHAGEAL MANOMETRY N/A 06/19/2023   Procedure: ESOPHAGEAL MANOMETRY (EM);  Surgeon: Shellia Cleverly, DO;  Location: [facility];  Service: Gastroenterology;  Laterality: N/A;   ESOPHAGOGASTRODUODENOSCOPY     ESOPHAGOGASTRODUODENOSCOPY N/A 11/03/2020   Procedure: ESOPHAGOGASTRODUODENOSCOPY (EGD);  Surgeon: Shellia Cleverly, DO;  Location: [facility];  Service: Gastroenterology;  Laterality: N/A;   ESOPHAGOGASTRODUODENOSCOPY (EGD) WITH PROPOFOL N/A 01/13/2020   Procedure: ESOPHAGOGASTRODUODENOSCOPY (EGD) WITH PROPOFOL;  Surgeon:  Earline Mayotte, MD;  Location: [facility];  Service: Endoscopy;  Laterality: N/A;   HIATAL HERNIA REPAIR N/A 11/03/2020   Procedure: LAPAROSCOPIC REPAIR OF HIATAL HERNIA;  Surgeon: Gaynelle Adu, MD;  Location: [facility];  Service: General;  Laterality: N/A;   LAPAROSCOPIC APPENDECTOMY N/A  04/01/2020   Procedure: APPENDECTOMY LAPAROSCOPIC;  Surgeon: Earline Mayotte, MD;  Location: [facility];  Service: General;  Laterality: N/A;   MINOR IRRIGATION AND DEBRIDEMENT OF WOUND Left    TONSILLECTOMY     TRANSORAL INCISIONLESS FUNDOPLICATION N/A 11/03/2020   Procedure: TRANSORAL INCISIONLESS FUNDOPLICATION;  Surgeon: Shellia Cleverly, DO;  Location: [facility];  Service: Gastroenterology;  Laterality: N/A;    Current Medications: Current Meds  Medication Sig   rosuvastatin (CRESTOR) 20 MG tablet Take 1 tablet (20 mg total) by mouth daily.     Allergies:   Lisinopril   Social History   Socioeconomic History   Marital status: Widowed    Spouse name: Not on file   Number of children: Not on file   Years of education: Not on file   Highest education level: Not on file  Occupational History   Not on file  Tobacco Use   Smoking status: Former   Smokeless tobacco: Never  Vaping Use   Vaping status: Never Used  Substance and Sexual Activity   Alcohol use: Yes    Alcohol/week: 5.0 standard drinks of alcohol    Types: 5 Standard drinks or equivalent per week    Comment: SOCIALLY   Drug use: No   Sexual activity: Not on file  Other Topics Concern   Not on file  Social History Narrative   Not on file   Social Drivers of Health   Financial Resource Strain: Not on file  Food Insecurity: Not on file  Transportation Needs: Not on file  Physical Activity: Not on file  Stress: Not on file  Social Connections: Not on file     Family History: The patient's family history includes CAD in an other family member; Cancer in her mother; Colon cancer in her mother. There is no history of Esophageal cancer, Rectal cancer, or Stomach cancer.  ROS:   Please see the history of present illness.     All other systems reviewed and are negative.  EKGs/Labs/Other Studies Reviewed:    The following studies were reviewed today:   EKG:    EKG Interpretation Date/Time:  Tuesday  July 16 2023 09:01:21 EST Ventricular Rate:  62 PR Interval:  166 QRS Duration:  80 QT Interval:  418 QTC Calculation: 424 R Axis:   -7  Text Interpretation: Normal sinus rhythm Low voltage QRS Cannot rule out Anterior infarct (cited on or before 14-Nov-2022) When compared with ECG of 14-Nov-2022 12:00, No significant change was found Confirmed by Dorotha Hirschi ([id number]) on 07/16/2023 9:12:03 AM         Recent Labs: 11/08/2022: BUN 11; Creatinine, Ser 0.78; Potassium 4.0; Sodium 141 11/14/2022: Hemoglobin 14.8; Magnesium 2.2; Platelets 280; TSH 2.590  Recent Lipid Panel    Component Value Date/Time   CHOL 165 11/08/2022 0934   TRIG 115 11/08/2022 0934   HDL 67 11/08/2022 0934   CHOLHDL 2.5 11/08/2022 0934   CHOLHDL 2.2 02/16/2013 1014   VLDL 15 02/16/2013 1014   LDLCALC 78 11/08/2022 0934   07/28/2019 HDL 77, total cholesterol 161, triglycerides 124  06/13/2023 Cholesterol 127, HDL 77, triglycerides 77 Hemoglobin 14.6, creatinine 0.79, ALT 54  Risk Assessment/Calculations:  Physical Exam:    VS:  BP 128/78 (BP Location: Left Arm, Patient Position: Sitting)   Pulse 62   Ht 5\' 5"  (1.651 m)   Wt 148 lb 6.4 oz (67.3 kg)   SpO2 98%   BMI 24.70 kg/m     Wt Readings from Last 3 Encounters:  07/16/23 148 lb 6.4 oz (67.3 kg)  07/12/23 151 lb 3.2 oz (68.6 kg)  05/07/23 147 lb (66.7 kg)     General: Alert, oriented x3, no distress Head: no evidence of trauma, PERRL, EOMI, no exophtalmos or lid lag, no myxedema, no xanthelasma; normal ears, nose and oropharynx Neck: normal jugular venous pulsations and no hepatojugular reflux; brisk carotid pulses without delay and no carotid bruits Chest: clear to auscultation, no signs of consolidation by percussion or palpation, normal fremitus, symmetrical and full respiratory excursions Cardiovascular: normal position and quality of the apical impulse, regular rhythm, normal first and second heart sounds, no murmurs, rubs or  gallops Abdomen: no tenderness or distention, no masses by palpation, no abnormal pulsatility or arterial bruits, normal bowel sounds, no hepatosplenomegaly Extremities: no clubbing, cyanosis or edema; 2+ radial, ulnar and brachial pulses bilaterally; 2+ right femoral, posterior tibial and dorsalis pedis pulses; 2+ left femoral, posterior tibial and dorsalis pedis pulses; no subclavian or femoral bruits Neurological: grossly nonfocal Psych: Normal mood and affect   ASSESSMENT:    1. Primary hypertension   2. Heterozygous familial hypercholesterolemia   3. Elevated Lp(a)   4. Hiatal hernia with GERD and esophagitis   5. Mild ascending aorta dilatation (HCC)      PLAN:    In order of problems listed above:  HLP: Has had an excellent response to the [facility] but also is still taking rosuvastatin and ezetimibe.  Will reduce the dose of rosuvastatin and recheck a lipid profile including LP(a) in a couple of months.  Ideally, target LDL reduction to less than 70, but less than 100 would be satisfactory.  She does not have evidence of CAD or PAD and has a remote normal nuclear stress test. HTN: Excellent control. GERD: Symptoms had resolved after her hiatal hernia surgery but have returned and she is back on PPI.  She plans to have repeat hiatal hernia repair. Mildly dilated ascending aorta: Borderline enlargement at 40 mm on coronary calcium score CT from June 2023.   If she has another CT as part of the workup for her hiatal hernia, we will probably be able to measure the ascending aorta on that study. Otherwise, plan to recheck by CT next year.         Medication Adjustments/Labs and Tests Ordered: Current medicines are reviewed at length with the patient today.  Concerns regarding medicines are outlined above.  Orders Placed This Encounter  Procedures   Lipid panel   Lipoprotein A (LPA)   EKG 12-Lead   Meds ordered this encounter  Medications   rosuvastatin (CRESTOR) 20 MG tablet     Sig: Take 1 tablet (20 mg total) by mouth daily.    Dispense:  90 tablet    Refill:  3    Patient Instructions  Medication Instructions:  Decrease Crestor to 20 mg ( Take 1 Tablet Daily). *If you need a refill on your cardiac medications before your next appointment, please call your pharmacy*   Lab Work: Lipid Panel, Lipoprotein A: To Be Done In April 2025 If you have labs (blood work) drawn today and your tests are completely normal, you will receive your results  only by: [employer] (if you have MyChart) OR A paper copy in the mail If you have any lab test that is abnormal or we need to change your treatment, we will call you to review the results.   Testing/Procedures: No Testing   Follow-Up: At [facility], you and your health needs are our priority.  As part of our continuing mission to provide you with exceptional heart care, we have created designated Provider Care Teams.  These Care Teams include your primary Cardiologist (physician) and Advanced Practice Providers (APPs -  Physician Assistants and Nurse Practitioners) who all work together to provide you with the care you need, when you need it.  We recommend signing up for the patient portal called "MyChart".  Sign up information is provided on this After Visit Summary.  MyChart is used to connect with patients for Virtual Visits (Telemedicine).  Patients are able to view lab/test results, encounter notes, upcoming appointments, etc.  Non-urgent messages can be sent to your provider as well.   To learn more about what you can do with MyChart, go to [email or web address].    Your next appointment:   1 year(s)  Provider:   Thurmon Fair, MD    Signed, Thurmon Fair, MD  07/16/2023 9:37 AM    [facility]

## 2023-07-24 ENCOUNTER — Ambulatory Visit: Payer: Medicare Other | Admitting: Gastroenterology

## 2023-07-25 ENCOUNTER — Ambulatory Visit: Payer: Medicare Other | Admitting: Gastroenterology

## 2023-07-25 ENCOUNTER — Encounter: Payer: Self-pay | Admitting: Cardiovascular Disease

## 2023-07-25 ENCOUNTER — Encounter: Payer: Self-pay | Admitting: Gastroenterology

## 2023-07-25 VITALS — BP 130/80 | HR 76 | Ht 64.0 in | Wt 147.5 lb

## 2023-07-25 DIAGNOSIS — K449 Diaphragmatic hernia without obstruction or gangrene: Secondary | ICD-10-CM | POA: Diagnosis not present

## 2023-07-25 DIAGNOSIS — R142 Eructation: Secondary | ICD-10-CM

## 2023-07-25 DIAGNOSIS — K2100 Gastro-esophageal reflux disease with esophagitis, without bleeding: Secondary | ICD-10-CM

## 2023-07-25 NOTE — Patient Instructions (Signed)
 [facility] will contact you with an appointment.   _______________________________________________________  If your blood pressure at your visit was 140/90 or greater, please contact your primary care physician to follow up on this.  _______________________________________________________  If you are age 76 or older, your body mass index should be between 23-30. Your Body mass index is 25.32 kg/m. If this is out of the aforementioned range listed, please consider follow up with your Primary Care Provider.  If you are age 57 or younger, your body mass index should be between 19-25. Your Body mass index is 25.32 kg/m. If this is out of the aformentioned range listed, please consider follow up with your Primary Care Provider.   ________________________________________________________  The [facility] providers would like to encourage you to use [facility] to communicate with providers for non-urgent requests or questions.  Due to long hold times on the telephone, sending your provider a message by [facility] may be a faster and more efficient way to get a response.  Please allow 48 business hours for a response.  Please remember that this is for non-urgent requests.  _______________________________________________________

## 2023-07-25 NOTE — Progress Notes (Signed)
 Chief Complaint:    GERD, discuss antireflux surgery  GI History: 78 year old female with a history of HTN, BCC, HLD, subdural hematoma after horse riding accident requiring surgical drainage 03/2021, follows in the GI clinic for the following:  1) GERD: Longstanding history of reflux. Index sxs infrequent HB, regurgitation, but most bothersome is chronic, dry cough. No dysphagia.  Previously responsive to Protonix 40 mg bid and Pepcid 40 mg qhs, along with avoidance of eating close to bedtime, sleeping with HOB elevated. -EGD (12/2019, Dr. Lemar Livings): LA Grade B esophagitis, 5 cm HH, normal stomach/duodenum -Barium swallow (07/19/2020): No stricture, moderate hiatal hernia, normal motility.  Significant inducible gastroesophageal reflux to the level of lower cervical spine.  Swallow 13 mm barium tablet without issue.  Unremarkable stomach/duodenum - EGD (08/2020, Dr. Barron Alvine): LA Grade B esophagitis, 5 cm HH, Hill grade 4 valve, normal stomach/duodenum - Laparoscopic hiatal hernia repair with concomitant TIF (11/03/2020) with 24 Serofuse fasteners placed - 11/17/2020: Follow-up in the GI clinic.  Complete resolution of reflux symptoms after cTIF and weaned off all acid suppression therapy  Additionally, she has a history of low-grade mucocele. - 12/2019: Colonoscopy: submucosal, nonobstructing mass at the appendiceal orifice.   - 01/2020: CT abdomen/pelvis: Appendiceal mucocele, large hiatal hernia, small hepatic cyst or hemangioma -03/2020: Appendectomy/cecectomy by Dr. Lemar Livings for 4.7 cm low-grade mucocele  HPI:     Patient is a 78 y.o. female presenting to the Gastroenterology Clinic for follow-up.  Was last seen by me on 04/26/2023.  She was doing very well after cTIF in 10/2020, but unfortunately had recurrence of reflux symptoms sometime after her horse riding accident in 03/2021.  Restarted famotidine 40 mg bid.  - 05/07/2023: EGD: LA Grade B erosive esophagitis, 3 cm hiatal hernia, TIF wrap  appeared loose, mild non-H. pylori gastritis.  Started Protonix 40 mg twice daily - 06/19/2023: Esophageal manometry: Normal - 06/28/2023: Barium esophagram: Asymmetric mild narrowing at the anterior distal esophagus/GE junction just above a small sliding hiatal hernia.  Suspect this is due to the previous fundoplication.  Mild esophageal dysmotility noted  Overall improvement with Protonix. She has since weaned off (does not want to take PPI unless absolutely necessary).  Tolerating all p.o. intake.  Was originally scheduled for repeat hiatal hernia repair and TIF on 09/20/2023, but she would like to postpone to August due to planning a cruise in end of May then trip to [place] this summer.  She returns from [place] 12/23/2023.   Review of systems:     No chest pain, no SOB, no fevers, no urinary sx   Past Medical History:  Diagnosis Date   Arthritis    Cancer (HCC)    BASAL CELL SKIN CANCER   Essential hypertension 05/03/2014   GERD (gastroesophageal reflux disease)    History of hiatal hernia    LARGE   HTN (hypertension)    stress test 08-04-2007 EF 70%; low risk scan , no significant ischemia   Hyperlipemia    Murmur, cardiac    2d-echo on 09-08-2012 EF  55-60% normal study, mild LVH, no significant valve disease   Pneumonia     Patient's surgical history, family medical history, social history, medications and allergies were all reviewed in Epic    Current Outpatient Medications  Medication Sig Dispense Refill   Alpha-Lipoic Acid 600 MG CAPS Take 600 mg by mouth every evening.     Aspirin-Calcium Carbonate (BAYER WOMENS) 81-777 MG TABS Take 81 mg by mouth at bedtime.  B Complex Vitamins (VITAMIN-B COMPLEX PO) Take 1 tablet by mouth daily.      Beta Carotene (VITAMIN A) 25000 UNIT capsule Take 25,000 Units by mouth every Monday, Wednesday, and Friday.     Biotin 1 MG CAPS Take 1 mg by mouth daily in the afternoon.     Cholecalciferol (VITAMIN D3) 10 MCG (400 UNIT) CAPS Take 400  Units by mouth daily.     Coenzyme Q10 (COQ-10 PO) Take 300 mg by mouth daily.     Cyanocobalamin (B-12 PO) Take 1,000 mcg by mouth daily.     famotidine (PEPCID) 40 MG tablet Take 1 tablet (40 mg total) by mouth 2 (two) times daily. 180 tablet 3   irbesartan-hydrochlorothiazide (AVALIDE) 300-12.5 MG tablet Take 1 tablet by mouth daily. Please schedule appointment for refills. (Patient taking differently: Take 1 tablet by mouth at bedtime.) 30 tablet 0   LOTEMAX 0.5 % OINT SMARTSIG:sparingly In Eye(s) Daily     Magnesium 400 MG CAPS Take 400 mg by mouth 2 (two) times daily.     Multiple Vitamin (MULTIVITAMIN WITH MINERALS) TABS tablet Take 1 tablet by mouth daily.     pantoprazole (PROTONIX) 40 MG tablet Take 1 tablet (40 mg total) by mouth 2 (two) times daily. Protonix 40 mg twice daily for 8 weeks, then reduce to once daily. 90 tablet 1   raloxifene (EVISTA) 60 MG tablet Take 60 mg by mouth every evening.      RESVERATROL PO Take 1 tablet by mouth daily. Vitamin supplement     rosuvastatin (CRESTOR) 20 MG tablet Take 1 tablet (20 mg total) by mouth daily. 90 tablet 3   tretinoin (RETIN-A) 0.1 % cream      vitamin C (ASCORBIC ACID) 500 MG tablet Take 500 mg by mouth daily.     vitamin E 180 MG (400 UNITS) capsule Take 400 Units by mouth daily.     ezetimibe (ZETIA) 10 MG tablet Take 1 tablet (10 mg total) by mouth daily. 90 tablet 3   No current facility-administered medications for this visit.    Physical Exam:     BP 130/80 (BP Location: Left Arm, Patient Position: Sitting, Cuff Size: Normal)   Pulse 76   Ht 5\' 4"  (1.626 m)   Wt 147 lb 8 oz (66.9 kg)   BMI 25.32 kg/m   GENERAL:  Pleasant female in NAD PSYCH: : Cooperative, normal affect Musculoskeletal:  Normal muscle tone, normal strength NEURO: Alert and oriented x 3, no focal neurologic deficits   IMPRESSION and PLAN:    1) GERD with erosive esophagitis 2) Hiatal hernia 3) Belching We again discussed role/utility of  repeat laparoscopic hiatal hernia repair and TIF, and she would very much like to proceed.  - To schedule follow-up appoint with Dr. Andrey Campanile in the surgical clinic to discuss redo hernia repair - Will postpone surgery due to her upcoming travels.  Can hopefully target a date in August pending OR schedule.  Will discuss with Dr. Andrey Campanile - Advised her to take Protonix 40 mg daily at least while she is traveling, and also continue after she returns from [place] through the OR date.  Will plan to titrate back off again after surgery  I spent 30 minutes of time, including in depth chart review, independent review of results as outlined above, communicating results with the patient directly, face-to-face time with the patient, coordinating care, and ordering studies and medications as appropriate, and documentation.  Shellia Cleverly ,DO, FACG 07/25/2023, 10:58 AM

## 2023-08-07 ENCOUNTER — Telehealth: Payer: Self-pay

## 2023-08-07 NOTE — Telephone Encounter (Signed)
-----   Message from Nurse Deno Etienne sent at 08/07/2023 12:30 PM EDT ----- This patient needs cTIF possibly sometime in August. She has lots of "travel plans" and wanted to get surgery done originally by early May. Since that was not possible, she is now pushing date out to August.  "- To schedule follow-up appoint with Dr. Andrey Campanile in the surgical clinic to discuss redo hernia repair - Will postpone surgery due to her upcoming travels.  Can hopefully target a date in August pending OR schedule.  Will discuss with Dr. Andrey Campanile"

## 2023-09-03 ENCOUNTER — Encounter: Payer: Self-pay | Admitting: Cardiovascular Disease

## 2023-09-03 LAB — LIPID PANEL
Chol/HDL Ratio: 1.7 ratio (ref 0.0–4.4)
Cholesterol, Total: 131 mg/dL (ref 100–199)
HDL: 79 mg/dL (ref >39)
LDL Chol Calc (NIH): 37 mg/dL (ref 0–99)
Triglycerides: 74 mg/dL (ref 0–149)
VLDL Cholesterol Cal: 15 mg/dL (ref 5–40)

## 2023-09-03 LAB — LIPOPROTEIN A (LPA): Lipoprotein (a): 216.8 nmol/L — ABNORMAL HIGH (ref <75.0)

## 2023-09-04 NOTE — Telephone Encounter (Signed)
 [employer]  Enrollment form complete - faxed to [employer] along with patient's records. Will await response.   Fax #: [phone]

## 2023-09-16 ENCOUNTER — Other Ambulatory Visit: Payer: Self-pay | Admitting: Gastroenterology

## 2023-10-03 ENCOUNTER — Other Ambulatory Visit: Payer: Self-pay

## 2023-10-03 MED ORDER — PANTOPRAZOLE SODIUM 40 MG PO TBEC: 40.0000 mg | DELAYED_RELEASE_TABLET | Freq: Every day | ORAL | 3 refills | Status: DC

## 2023-11-13 ENCOUNTER — Other Ambulatory Visit: Payer: Self-pay | Admitting: Cardiovascular Disease

## 2023-11-14 NOTE — Telephone Encounter (Signed)
 cTIF to be scheduled in October. Dr. Tanda needs OR room with robot, none available in August. Dr. Tanda will be out for September. [facility] will contact us  with an OR date when available.

## 2023-12-30 ENCOUNTER — Other Ambulatory Visit (HOSPITAL_COMMUNITY): Payer: Self-pay | Admitting: Internal Medicine

## 2023-12-30 DIAGNOSIS — M79672 Pain in left foot: Secondary | ICD-10-CM

## 2024-01-01 ENCOUNTER — Ambulatory Visit (HOSPITAL_COMMUNITY)
Admission: RE | Admit: 2024-01-01 | Discharge: 2024-01-01 | Disposition: A | Discharge status: Home / Self Care | Source: Ambulatory Visit | Attending: Internal Medicine | Admitting: Internal Medicine

## 2024-01-01 DIAGNOSIS — M79672 Pain in left foot: Secondary | ICD-10-CM | POA: Diagnosis present

## 2024-01-08 ENCOUNTER — Telehealth: Payer: Self-pay | Admitting: Gastroenterology

## 2024-01-08 ENCOUNTER — Encounter: Payer: Self-pay | Admitting: Orthopedic Surgery

## 2024-01-08 ENCOUNTER — Ambulatory Visit (INDEPENDENT_AMBULATORY_CARE_PROVIDER_SITE_OTHER): Admitting: Orthopedic Surgery

## 2024-01-08 VITALS — Ht 65.0 in | Wt 146.5 lb

## 2024-01-08 DIAGNOSIS — S92512A Displaced fracture of proximal phalanx of left lesser toe(s), initial encounter for closed fracture: Secondary | ICD-10-CM

## 2024-01-08 NOTE — Progress Notes (Signed)
 Chief complaint pain small toe, left foot  History 78 year old female fractured her left small toe sometime around July 10.  However she had to go to [place] for 3 weeks so she tape the toes together presented back to her primary care on return to [place]  x-rays show a fracture displaced small toe at the occipital phalanx  She has some shoewear issues but that seems to have resolved with resolution of most of the swelling.  She does alter her gait a little bit.  Examination of both feet shows that the left small toe is swollen but is not malaligned it is a little tender.  Outside images were reviewed there is a displaced fracture proximal phalanx  Assessment and plan  Discussed this with Perri.  I told her that surgery would be a situation where we do a lot of working for little gain and then if she has a complication she will which she had never had it  She is going to try to manage without surgery and I think that is the better plan  Encounter Diagnosis  Name Primary?   Closed displaced fracture of proximal phalanx of lesser toe of left foot, initial encounter Yes

## 2024-01-08 NOTE — Telephone Encounter (Signed)
 Inbound call from patient stating she spoke to Dr.Wilson in regards to the cTIF and Dr.Wilson stated it will be risky to follow up with Dr.Cirigliano first   Please advise  Thank you

## 2024-01-08 NOTE — Telephone Encounter (Signed)
 Dr. Tanda has notified Dr. San that we will not be proceeding with cTIF and patient will be seen in the office for a follow up with Dr. San. MyChart message to patient.

## 2024-01-09 DIAGNOSIS — K219 Gastro-esophageal reflux disease without esophagitis: Secondary | ICD-10-CM

## 2024-01-09 DIAGNOSIS — R053 Chronic cough: Secondary | ICD-10-CM

## 2024-01-10 ENCOUNTER — Ambulatory Visit (INDEPENDENT_AMBULATORY_CARE_PROVIDER_SITE_OTHER): Payer: Medicare Other | Admitting: *Deleted

## 2024-01-10 VITALS — BP 120/82 | HR 73 | Temp 98.3°F | Resp 16 | Ht 65.0 in | Wt 148.6 lb

## 2024-01-10 DIAGNOSIS — I2510 Atherosclerotic heart disease of native coronary artery without angina pectoris: Secondary | ICD-10-CM

## 2024-01-10 DIAGNOSIS — I10 Essential (primary) hypertension: Secondary | ICD-10-CM

## 2024-01-10 DIAGNOSIS — E785 Hyperlipidemia, unspecified: Secondary | ICD-10-CM | POA: Diagnosis not present

## 2024-01-10 MED ORDER — INCLISIRAN SODIUM 284 MG/1.5ML ~~LOC~~ SOSY
284.0000 mg | PREFILLED_SYRINGE | Freq: Once | SUBCUTANEOUS | Status: AC
  Administered 2024-01-10: 284 mg via SUBCUTANEOUS
  Filled 2024-01-10: qty 1.5

## 2024-01-10 NOTE — Progress Notes (Signed)
 Diagnosis: Hyperlipidemia  Provider:  Chilton Greathouse MD  Procedure: Injection  Leqvio (inclisiran), Dose: 284 mg, Site: subcutaneous, Number of injections: 1  Injection Site(s): Left arm  Post Care: Observation period completed  Discharge: Condition: Good, Destination: Home . AVS Declined  Performed by:  Forrest Moron, RN

## 2024-01-13 ENCOUNTER — Ambulatory Visit: Admitting: Orthopedic Surgery

## 2024-01-21 NOTE — Telephone Encounter (Signed)
 See 8/20 TE. Patient is no longer proceeding with cTIF.

## 2024-03-17 ENCOUNTER — Encounter: Payer: Self-pay | Admitting: Gastroenterology

## 2024-03-17 ENCOUNTER — Ambulatory Visit: Admitting: Gastroenterology

## 2024-03-17 VITALS — BP 128/70 | HR 63 | Ht 62.5 in | Wt 145.5 lb

## 2024-03-17 DIAGNOSIS — R142 Eructation: Secondary | ICD-10-CM

## 2024-03-17 DIAGNOSIS — K2100 Gastro-esophageal reflux disease with esophagitis, without bleeding: Secondary | ICD-10-CM

## 2024-03-17 DIAGNOSIS — K449 Diaphragmatic hernia without obstruction or gangrene: Secondary | ICD-10-CM | POA: Diagnosis not present

## 2024-03-17 NOTE — Patient Instructions (Addendum)
 _______________________________________________________  If your blood pressure at your visit was 140/90 or greater, please contact your primary care physician to follow up on this.  _______________________________________________________  If you are age 76 or older, your body mass index should be between 23-30. Your Body mass index is 26.19 kg/m. If this is out of the aforementioned range listed, please consider follow up with your Primary Care Provider.  If you are age 82 or younger, your body mass index should be between 19-25. Your Body mass index is 26.19 kg/m. If this is out of the aformentioned range listed, please consider follow up with your Primary Care Provider.   ________________________________________________________  The [facility] providers would like to encourage you to use MYCHART to communicate with providers for non-urgent requests or questions.  Due to long hold times on the telephone, sending your provider a message by [facility] may be a faster and more efficient way to get a response.  Please allow 48 business hours for a response.  Please remember that this is for non-urgent requests.  _______________________________________________________  [facility] is using a team-based approach to care.  Your team is made up of your doctor and two to three APPS. Our APPS (Nurse Practitioners and Physician Assistants) work with your physician to ensure care continuity for you. They are fully qualified to address your health concerns and develop a treatment plan. They communicate directly with your gastroenterologist to care for you. Seeing the Advanced Practice Practitioners on your physician's team can help you by facilitating care more promptly, often allowing for earlier appointments, access to diagnostic testing, procedures, and other specialty referrals.   Reflux Gourmet Rescue   It is an ALGINATE THERAPY which is the only intervention that works to safeguard the  esophagus by creating a protective barrier that actually stops reflux from happening. -The general directions for use are as stated on the packaging: Take 1 teaspoon (5 ml), or more as needed or as directed by your physician, after meals and before bed. -These general directions address the most common times for reflux to occur, but our Rescue products may be taken anytime. Some individuals may take our product preemptively, when they know they will suffer from reflux, or as needed - when discomfort arises. (If taken around food, it should be consumed last.) -You do not have to take 1 teaspoon (5 ml) of the product. While one teaspoon (5ml) may be the perfect average amount to relieve reflux suffering in some, others may require more or less. You may adjust the amount of Mint Chocolate Rescue and Vanilla Caramel Rescue to the lowest amount necessary to meet your individual needs to improve your quality of life. -You may dilute the product if it is too viscous for you to consume. Keep in mind, however, that the thickness of the product was formulated to provide optimal coating and protection of your throat and esophagus. Though diluting the product is possible, it may reduce the protective function and/or length of action. -This can be used in conjunction with reflux medications and lifestyle changes.   100% ALL-NATURAL  Paraben FREE, glycerin FREE, & potassium FREE  Made entirely from all-natural ingredients considered safe for children and during pregnancy  No known side effects  All-natural flavor Gluten FREE  Allergen FREE  Vegan   Can find more information here: nameseizer.co.nz  It was a pleasure to see you today!  Vito Cirigliano, D.O.

## 2024-03-17 NOTE — Progress Notes (Signed)
 Chief Complaint:    GERD, increased throat clearing  GI History: 78 year old female with a history of HTN, BCC, HLD, subdural hematoma after horse riding accident requiring surgical drainage 03/2021, follows in the GI clinic for the following:  1) GERD: Longstanding history of reflux. Index sxs infrequent HB, regurgitation, but most bothersome is chronic, dry cough. No dysphagia.  Previously responsive to Protonix  40 mg bid and Pepcid  40 mg qhs, along with avoidance of eating close to bedtime, sleeping with HOB elevated. -EGD (12/2019, Dr. Dessa): LA Grade B esophagitis, 5 cm HH, normal stomach/duodenum -Barium swallow (07/19/2020): No stricture, moderate hiatal hernia, normal motility.  Significant inducible gastroesophageal reflux to the level of lower cervical spine.  Swallow 13 mm barium tablet without issue.  Unremarkable stomach/duodenum - EGD (08/2020, Dr. San): LA Grade B esophagitis, 5 cm HH, Hill grade 4 valve, normal stomach/duodenum - Laparoscopic hiatal hernia repair with concomitant TIF (11/03/2020) with 24 Serofuse fasteners placed - 11/17/2020: Follow-up in the GI clinic.  Complete resolution of reflux symptoms after cTIF and weaned off all acid suppression therapy  - 05/07/2023: EGD: LA Grade B erosive esophagitis, 3 cm hiatal hernia, TIF wrap appeared loose, mild non-H. pylori gastritis.  Started Protonix  40 mg twice daily - 06/19/2023: Esophageal manometry: Normal - 06/28/2023: Barium esophagram: Asymmetric mild narrowing at the anterior distal esophagus/GE junction just above a small sliding hiatal hernia.  Suspect this is due to the previous fundoplication.  Mild esophageal dysmotility noted.  No spontaneous refluxate  Additionally, she has a history of low-grade mucocele. - 12/2019: Colonoscopy: submucosal, nonobstructing mass at the appendiceal orifice.   - 01/2020: CT abdomen/pelvis: Appendiceal mucocele, large hiatal hernia, small hepatic cyst or hemangioma -03/2020:  Appendectomy/cecectomy by Dr. Dessa for 4.7 cm low-grade mucocele  HPI:     Patient is a 78 y.o. female presenting to the Gastroenterology Clinic for evaluation of reflux symptoms and increased throat clearing.  Last seen by me on 07/25/2023.  History of GERD and had done well with cTIF in 10/2020, but unfortunately had recurrence of reflux symptoms sometime after her horse riding accident in 03/2021 with return of hiatal hernia, partially undone wrap, and return of reflux symptoms with erosive esophagitis.  Previously discussed repeat hiatal hernia pair with TIF which was initially postponed due to her travel schedule.  She met with Dr. Tanda on 09/23/2023 and patient ultimately decided to hold off on repeat surgery due to elevated risks of redo surgery.  Currently famotidine  40 mg BID and symptoms generally under control. Uses Papaya enzyme prn. Eats slow, avoids eating within 3 hours of bedtime.   Review of systems:     No chest pain, no SOB, no fevers, no urinary sx   Past Medical History:  Diagnosis Date   Arthritis    Cancer (HCC)    BASAL CELL SKIN CANCER   Essential hypertension 05/03/2014   GERD (gastroesophageal reflux disease)    History of hiatal hernia    LARGE   HTN (hypertension)    stress test 08-04-2007 EF 70%; low risk scan , no significant ischemia   Hyperlipemia    Murmur, cardiac    2d-echo on 09-08-2012 EF  55-60% normal study, mild LVH, no significant valve disease   Pneumonia     Patient's surgical history, family medical history, social history, medications and allergies were all reviewed in Epic    Current Outpatient Medications  Medication Sig Dispense Refill   Alpha-Lipoic Acid 600 MG CAPS Take 600 mg by mouth  every evening.     Aspirin-Calcium  Carbonate (BAYER WOMENS) 81-777 MG TABS Take 81 mg by mouth at bedtime.     B Complex Vitamins (VITAMIN-B COMPLEX PO) Take 1 tablet by mouth daily.      Beta Carotene (VITAMIN A ) 25000 UNIT capsule Take 25,000  Units by mouth every Monday, Wednesday, and Friday.     Biotin 1 MG CAPS Take 1 mg by mouth daily in the afternoon.     Cholecalciferol  (VITAMIN D3) 10 MCG (400 UNIT) CAPS Take 400 Units by mouth daily.     Coenzyme Q10 (COQ-10 PO) Take 300 mg by mouth daily.     Cyanocobalamin  (B-12 PO) Take 1,000 mcg by mouth daily.     ezetimibe  (ZETIA ) 10 MG tablet Take 1 tablet (10 mg total) by mouth daily. 90 tablet 2   famotidine  (PEPCID ) 40 MG tablet Take 1 tablet (40 mg total) by mouth 2 (two) times daily. 180 tablet 3   irbesartan -hydrochlorothiazide  (AVALIDE) 300-12.5 MG tablet Take 1 tablet by mouth daily. Please schedule appointment for refills. (Patient taking differently: Take 1 tablet by mouth at bedtime.) 30 tablet 0   LOTEMAX 0.5 % OINT SMARTSIG:sparingly In Eye(s) Daily     Magnesium  400 MG CAPS Take 400 mg by mouth 2 (two) times daily.     Multiple Vitamin (MULTIVITAMIN WITH MINERALS) TABS tablet Take 1 tablet by mouth daily.     raloxifene  (EVISTA ) 60 MG tablet Take 60 mg by mouth every evening.      RESVERATROL PO Take 1 tablet by mouth daily. Vitamin supplement     tretinoin (RETIN-A) 0.1 % cream      vitamin C (ASCORBIC ACID ) 500 MG tablet Take 500 mg by mouth daily.     vitamin E 180 MG (400 UNITS) capsule Take 400 Units by mouth daily.     rosuvastatin  (CRESTOR ) 20 MG tablet Take 1 tablet (20 mg total) by mouth daily. (Patient not taking: Reported on 03/17/2024) 90 tablet 3   No current facility-administered medications for this visit.    Physical Exam:     BP 128/70   Pulse 63   Ht 5' 2.5 (1.588 m) Comment: measured without shoes  Wt 145 lb 8 oz (66 kg)   BMI 26.19 kg/m   GENERAL:  Pleasant female in NAD PSYCH: : Cooperative, normal affect NEURO: Alert and oriented x 3, no focal neurologic deficits   IMPRESSION and PLAN:    1) GERD with erosive esophagitis 2) Hiatal hernia 78 year old female with longstanding history of GERD.  Initially did very well with cTIF in  10/2020 with resolution of reflux symptoms and titrated off all acid suppression therapy completely.  Unfortunately has had redevelopment of hiatal hernia and reflux presumably 2/2 significant trauma suffered during a horse riding accident in 03/2021.  Subsequent EGD shows erosive esophagitis, recurrence of 3 cm hiatal hernia, and loose wrap.  She met with Dr. Tanda to discuss redo surgery.  Due to elevated risks with redo surgery, she would like to proceed with medical management alone at this juncture.  She otherwise maintains a very active lifestyle and does not want to jeopardize that with elective surgery.  - Continue famotidine  - Add reflux Gourmet  Can follow-up with me on an as-needed basis  I spent 30 minutes of time, including in depth chart review, independent review of results as outlined above, communicating results with the patient directly, face-to-face time with the patient, coordinating care, and ordering studies and medications as appropriate, and  documentation.          Sandor GAILS Khayden Herzberg ,DO, FACG 03/17/2024, 11:03 AM

## 2024-05-09 ENCOUNTER — Other Ambulatory Visit: Payer: Self-pay | Admitting: Gastroenterology

## 2024-05-09 ENCOUNTER — Other Ambulatory Visit: Payer: Self-pay | Admitting: Cardiovascular Disease

## 2024-05-29 ENCOUNTER — Telehealth: Payer: Self-pay

## 2024-05-29 NOTE — Telephone Encounter (Signed)
 Auth Submission: NO AUTH NEEDED Site of care: Site of care: [facility] Payer: Medicare A/B with AARP supplement Medication & CPT/J Code(s) submitted: Leqvio  (Inclisiran) J1306 Diagnosis Code:  Route of submission (phone, fax, portal):  Phone # Fax # Auth type: Buy/Bill PB Units/visits requested: 284mg  x 2 doses Reference number:  Approval from: 05/29/24 to 06/20/25

## 2024-07-17 ENCOUNTER — Ambulatory Visit
